# Patient Record
Sex: Female | Born: 1960 | Race: White | Hispanic: No | Marital: Married | Smoking: Never smoker
Health system: Southern US, Community
[De-identification: ages and names within clinical notes are randomized; demographics above are authoritative.]

## PROBLEM LIST (undated history)

## (undated) DIAGNOSIS — F32A Depression, unspecified: Secondary | ICD-10-CM

## (undated) DIAGNOSIS — F329 Major depressive disorder, single episode, unspecified: Secondary | ICD-10-CM

## (undated) DIAGNOSIS — E785 Hyperlipidemia, unspecified: Secondary | ICD-10-CM

## (undated) DIAGNOSIS — I10 Essential (primary) hypertension: Secondary | ICD-10-CM

## (undated) DIAGNOSIS — T7840XA Allergy, unspecified, initial encounter: Secondary | ICD-10-CM

## (undated) HISTORY — DX: Hyperlipidemia, unspecified: E78.5

## (undated) HISTORY — DX: Depression, unspecified: F32.A

## (undated) HISTORY — DX: Major depressive disorder, single episode, unspecified: F32.9

## (undated) HISTORY — DX: Allergy, unspecified, initial encounter: T78.40XA

## (undated) HISTORY — PX: SEPTOPLASTY: SUR1290

## (undated) HISTORY — PX: TONSILLECTOMY: SUR1361

## (undated) HISTORY — DX: Essential (primary) hypertension: I10

---

## 1999-10-29 ENCOUNTER — Other Ambulatory Visit: Admission: RE | Admit: 1999-10-29 | Discharge: 1999-10-29 | Payer: Self-pay | Admitting: Gynecology

## 2000-10-29 ENCOUNTER — Other Ambulatory Visit: Admission: RE | Admit: 2000-10-29 | Discharge: 2000-10-29 | Payer: Self-pay | Admitting: Gynecology

## 2001-03-05 ENCOUNTER — Other Ambulatory Visit: Admission: RE | Admit: 2001-03-05 | Discharge: 2001-03-05 | Payer: Self-pay | Admitting: Gynecology

## 2001-06-23 ENCOUNTER — Other Ambulatory Visit: Admission: RE | Admit: 2001-06-23 | Discharge: 2001-06-23 | Payer: Self-pay | Admitting: Gynecology

## 2001-11-01 ENCOUNTER — Other Ambulatory Visit: Admission: RE | Admit: 2001-11-01 | Discharge: 2001-11-01 | Payer: Self-pay | Admitting: Gynecology

## 2003-07-05 ENCOUNTER — Other Ambulatory Visit: Admission: RE | Admit: 2003-07-05 | Discharge: 2003-07-05 | Payer: Self-pay | Admitting: Gynecology

## 2004-11-20 ENCOUNTER — Ambulatory Visit (HOSPITAL_BASED_OUTPATIENT_CLINIC_OR_DEPARTMENT_OTHER): Admission: RE | Admit: 2004-11-20 | Discharge: 2004-11-20 | Payer: Self-pay | Admitting: Otolaryngology

## 2005-08-11 ENCOUNTER — Other Ambulatory Visit: Admission: RE | Admit: 2005-08-11 | Discharge: 2005-08-11 | Payer: Self-pay | Admitting: Gynecology

## 2006-09-22 ENCOUNTER — Other Ambulatory Visit: Admission: RE | Admit: 2006-09-22 | Discharge: 2006-09-22 | Payer: Self-pay | Admitting: Gynecology

## 2007-03-18 ENCOUNTER — Ambulatory Visit: Payer: Self-pay | Admitting: Family Medicine

## 2007-03-18 ENCOUNTER — Encounter: Payer: Self-pay | Admitting: Family Medicine

## 2007-03-18 ENCOUNTER — Encounter (INDEPENDENT_AMBULATORY_CARE_PROVIDER_SITE_OTHER): Payer: Self-pay | Admitting: Family Medicine

## 2007-03-18 DIAGNOSIS — F339 Major depressive disorder, recurrent, unspecified: Secondary | ICD-10-CM

## 2007-03-18 DIAGNOSIS — J309 Allergic rhinitis, unspecified: Secondary | ICD-10-CM | POA: Insufficient documentation

## 2007-03-18 LAB — CONVERTED CEMR LAB
Basophils Absolute: 0.1 10*3/uL (ref 0.0–0.1)
Basophils Relative: 1 % (ref 0.0–1.0)
HCT: 39.7 % (ref 36.0–46.0)
Hemoglobin: 13.7 g/dL (ref 12.0–15.0)
MCHC: 34.5 g/dL (ref 30.0–36.0)
Monocytes Absolute: 0.5 10*3/uL (ref 0.2–0.7)
Neutrophils Relative %: 71 % (ref 43.0–77.0)
RDW: 14.8 % — ABNORMAL HIGH (ref 11.5–14.6)

## 2007-05-15 ENCOUNTER — Ambulatory Visit: Payer: Self-pay | Admitting: Family Medicine

## 2007-07-20 ENCOUNTER — Ambulatory Visit: Payer: Self-pay | Admitting: Internal Medicine

## 2007-07-20 LAB — CONVERTED CEMR LAB
Basophils Absolute: 0.1 10*3/uL (ref 0.0–0.1)
Hemoglobin: 13 g/dL (ref 12.0–15.0)
Lymphocytes Relative: 24.2 % (ref 12.0–46.0)
MCHC: 34.4 g/dL (ref 30.0–36.0)
MCV: 83.9 fL (ref 78.0–100.0)
Monocytes Absolute: 0.5 10*3/uL (ref 0.2–0.7)
Monocytes Relative: 4.9 % (ref 3.0–11.0)
Neutro Abs: 6.1 10*3/uL (ref 1.4–7.7)

## 2007-07-21 ENCOUNTER — Encounter: Payer: Self-pay | Admitting: Internal Medicine

## 2007-08-24 ENCOUNTER — Ambulatory Visit: Payer: Self-pay | Admitting: Internal Medicine

## 2007-09-24 DIAGNOSIS — G4733 Obstructive sleep apnea (adult) (pediatric): Secondary | ICD-10-CM

## 2007-09-24 DIAGNOSIS — J40 Bronchitis, not specified as acute or chronic: Secondary | ICD-10-CM

## 2007-10-15 ENCOUNTER — Ambulatory Visit: Payer: Self-pay | Admitting: Family Medicine

## 2007-10-15 ENCOUNTER — Encounter: Admission: RE | Admit: 2007-10-15 | Discharge: 2007-10-15 | Payer: Self-pay | Admitting: Family Medicine

## 2007-10-15 ENCOUNTER — Telehealth (INDEPENDENT_AMBULATORY_CARE_PROVIDER_SITE_OTHER): Payer: Self-pay | Admitting: *Deleted

## 2007-10-15 DIAGNOSIS — S8010XA Contusion of unspecified lower leg, initial encounter: Secondary | ICD-10-CM

## 2007-10-29 ENCOUNTER — Telehealth (INDEPENDENT_AMBULATORY_CARE_PROVIDER_SITE_OTHER): Payer: Self-pay | Admitting: *Deleted

## 2007-11-01 ENCOUNTER — Ambulatory Visit: Payer: Self-pay | Admitting: Family Medicine

## 2007-11-01 DIAGNOSIS — B0059 Other herpesviral disease of eye: Secondary | ICD-10-CM

## 2007-11-04 ENCOUNTER — Telehealth (INDEPENDENT_AMBULATORY_CARE_PROVIDER_SITE_OTHER): Payer: Self-pay | Admitting: *Deleted

## 2007-11-29 ENCOUNTER — Telehealth (INDEPENDENT_AMBULATORY_CARE_PROVIDER_SITE_OTHER): Payer: Self-pay | Admitting: *Deleted

## 2008-01-17 ENCOUNTER — Telehealth (INDEPENDENT_AMBULATORY_CARE_PROVIDER_SITE_OTHER): Payer: Self-pay | Admitting: *Deleted

## 2008-01-17 ENCOUNTER — Encounter (INDEPENDENT_AMBULATORY_CARE_PROVIDER_SITE_OTHER): Payer: Self-pay | Admitting: Family Medicine

## 2008-01-17 ENCOUNTER — Ambulatory Visit: Payer: Self-pay | Admitting: Family Medicine

## 2008-01-17 DIAGNOSIS — M25569 Pain in unspecified knee: Secondary | ICD-10-CM

## 2008-01-24 ENCOUNTER — Encounter: Admission: RE | Admit: 2008-01-24 | Discharge: 2008-01-24 | Payer: Self-pay | Admitting: Neurosurgery

## 2008-01-25 ENCOUNTER — Telehealth (INDEPENDENT_AMBULATORY_CARE_PROVIDER_SITE_OTHER): Payer: Self-pay | Admitting: *Deleted

## 2008-01-28 ENCOUNTER — Encounter (INDEPENDENT_AMBULATORY_CARE_PROVIDER_SITE_OTHER): Payer: Self-pay | Admitting: Family Medicine

## 2008-02-15 ENCOUNTER — Observation Stay (HOSPITAL_COMMUNITY): Admission: EM | Admit: 2008-02-15 | Discharge: 2008-02-15 | Payer: Self-pay | Admitting: Emergency Medicine

## 2008-03-16 ENCOUNTER — Telehealth (INDEPENDENT_AMBULATORY_CARE_PROVIDER_SITE_OTHER): Payer: Self-pay | Admitting: *Deleted

## 2008-10-09 ENCOUNTER — Ambulatory Visit: Payer: Self-pay | Admitting: Family Medicine

## 2008-10-09 DIAGNOSIS — R030 Elevated blood-pressure reading, without diagnosis of hypertension: Secondary | ICD-10-CM

## 2008-10-31 IMAGING — CR DG CHEST 2V
2 series · 2 of 2 positions shown · non-contrast
Comparison: None

CLINICAL DATA: Followup bronchitis

CHEST - 2 VIEW:

[view not recorded (1 of 2)]
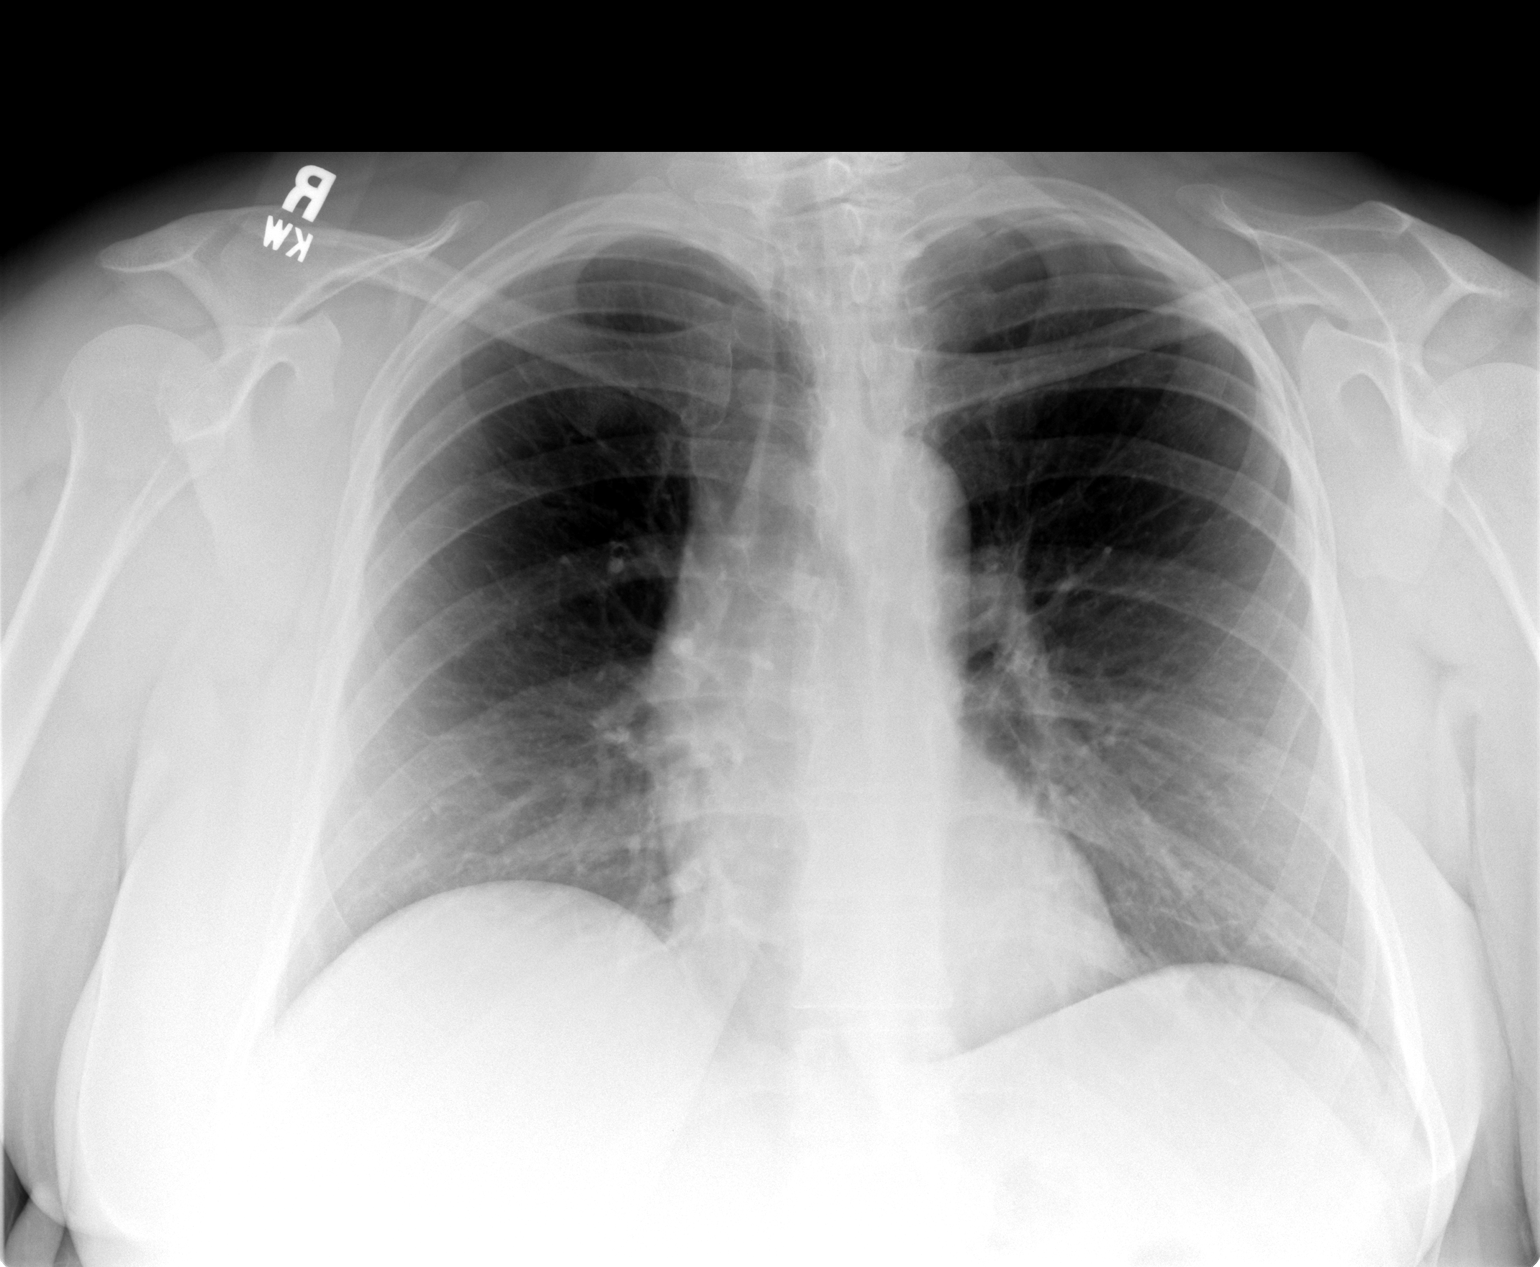

[view not recorded (2 of 2)]
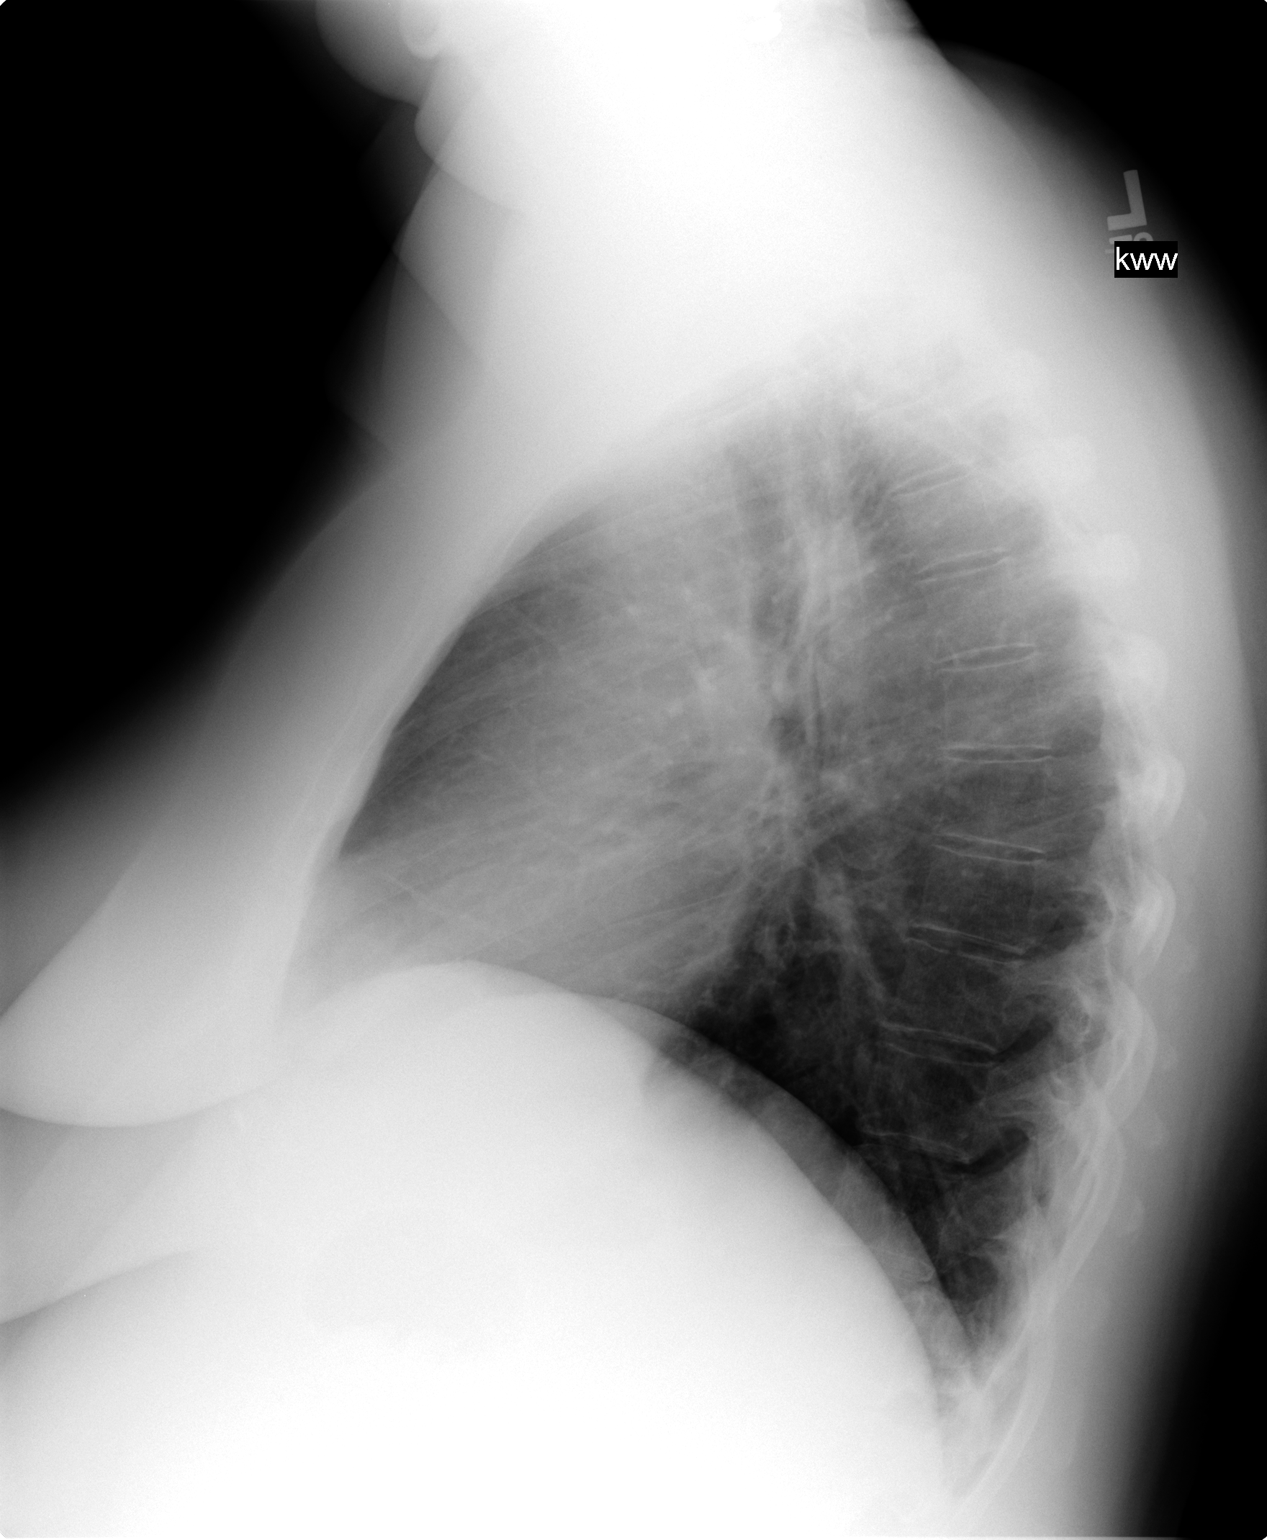

[2 of 2 positions shown; findings below may reference images not displayed]

FINDINGS: The heart size and mediastinal contours are within normal limits. 
Both lungs are clear.  The visualized skeletal structures are unremarkable.
IMPRESSION: No active cardiopulmonary disease

## 2008-11-06 ENCOUNTER — Ambulatory Visit: Payer: Self-pay | Admitting: Family Medicine

## 2008-11-06 DIAGNOSIS — I10 Essential (primary) hypertension: Secondary | ICD-10-CM

## 2008-11-06 LAB — CONVERTED CEMR LAB
Calcium: 8.9 mg/dL (ref 8.4–10.5)
GFR calc Af Amer: 68 mL/min
Sodium: 137 meq/L (ref 135–145)

## 2008-12-06 ENCOUNTER — Ambulatory Visit: Payer: Self-pay | Admitting: Family Medicine

## 2008-12-06 DIAGNOSIS — M722 Plantar fascial fibromatosis: Secondary | ICD-10-CM

## 2008-12-11 ENCOUNTER — Telehealth (INDEPENDENT_AMBULATORY_CARE_PROVIDER_SITE_OTHER): Payer: Self-pay | Admitting: *Deleted

## 2008-12-11 LAB — CONVERTED CEMR LAB
Albumin: 3.4 g/dL — ABNORMAL LOW (ref 3.5–5.2)
Alkaline Phosphatase: 70 units/L (ref 39–117)
BUN: 10 mg/dL (ref 6–23)
Calcium: 8.7 mg/dL (ref 8.4–10.5)
Cholesterol: 140 mg/dL (ref 0–200)
Direct LDL: 76.5 mg/dL
Eosinophils Absolute: 0.3 10*3/uL (ref 0.0–0.7)
Eosinophils Relative: 4.3 % (ref 0.0–5.0)
GFR calc Af Amer: 86 mL/min
GFR calc non Af Amer: 71 mL/min
HCT: 34.6 % — ABNORMAL LOW (ref 36.0–46.0)
Hemoglobin: 11.7 g/dL — ABNORMAL LOW (ref 12.0–15.0)
MCV: 82.7 fL (ref 78.0–100.0)
Monocytes Absolute: 0.4 10*3/uL (ref 0.1–1.0)
Neutro Abs: 5.4 10*3/uL (ref 1.4–7.7)
Platelets: 251 10*3/uL (ref 150–400)
Potassium: 4.2 meq/L (ref 3.5–5.1)
RDW: 15.1 % — ABNORMAL HIGH (ref 11.5–14.6)
TSH: 1.21 microintl units/mL (ref 0.35–5.50)
Total Protein: 6.7 g/dL (ref 6.0–8.3)
Triglycerides: 250 mg/dL (ref 0–149)
WBC: 7.8 10*3/uL (ref 4.5–10.5)

## 2008-12-13 ENCOUNTER — Encounter (INDEPENDENT_AMBULATORY_CARE_PROVIDER_SITE_OTHER): Payer: Self-pay | Admitting: *Deleted

## 2008-12-13 ENCOUNTER — Ambulatory Visit: Payer: Self-pay | Admitting: Family Medicine

## 2008-12-13 DIAGNOSIS — J019 Acute sinusitis, unspecified: Secondary | ICD-10-CM

## 2008-12-25 ENCOUNTER — Encounter (INDEPENDENT_AMBULATORY_CARE_PROVIDER_SITE_OTHER): Payer: Self-pay | Admitting: *Deleted

## 2009-02-08 ENCOUNTER — Ambulatory Visit: Payer: Self-pay | Admitting: Family Medicine

## 2009-02-28 ENCOUNTER — Ambulatory Visit: Payer: Self-pay | Admitting: Family Medicine

## 2009-02-28 DIAGNOSIS — K219 Gastro-esophageal reflux disease without esophagitis: Secondary | ICD-10-CM

## 2009-03-02 ENCOUNTER — Telehealth (INDEPENDENT_AMBULATORY_CARE_PROVIDER_SITE_OTHER): Payer: Self-pay | Admitting: *Deleted

## 2009-03-08 ENCOUNTER — Encounter: Payer: Self-pay | Admitting: Family Medicine

## 2009-08-02 ENCOUNTER — Ambulatory Visit: Payer: Self-pay | Admitting: Family Medicine

## 2009-08-02 DIAGNOSIS — L659 Nonscarring hair loss, unspecified: Secondary | ICD-10-CM | POA: Insufficient documentation

## 2009-08-02 DIAGNOSIS — L989 Disorder of the skin and subcutaneous tissue, unspecified: Secondary | ICD-10-CM | POA: Insufficient documentation

## 2009-08-02 DIAGNOSIS — G5620 Lesion of ulnar nerve, unspecified upper limb: Secondary | ICD-10-CM

## 2009-08-31 ENCOUNTER — Encounter: Payer: Self-pay | Admitting: Internal Medicine

## 2009-09-11 ENCOUNTER — Ambulatory Visit: Payer: Self-pay | Admitting: Family Medicine

## 2009-09-11 DIAGNOSIS — N390 Urinary tract infection, site not specified: Secondary | ICD-10-CM

## 2009-09-11 LAB — CONVERTED CEMR LAB
Bilirubin Urine: NEGATIVE
Glucose, Urine, Semiquant: NEGATIVE
Protein, U semiquant: NEGATIVE
Specific Gravity, Urine: <1.005
pH: 7.5

## 2009-09-12 ENCOUNTER — Encounter: Payer: Self-pay | Admitting: Family Medicine

## 2009-09-17 ENCOUNTER — Telehealth (INDEPENDENT_AMBULATORY_CARE_PROVIDER_SITE_OTHER): Payer: Self-pay | Admitting: *Deleted

## 2009-11-23 ENCOUNTER — Encounter (INDEPENDENT_AMBULATORY_CARE_PROVIDER_SITE_OTHER): Payer: Self-pay | Admitting: *Deleted

## 2010-01-21 ENCOUNTER — Telehealth: Payer: Self-pay | Admitting: Family

## 2010-01-21 ENCOUNTER — Encounter (INDEPENDENT_AMBULATORY_CARE_PROVIDER_SITE_OTHER): Payer: Self-pay | Admitting: *Deleted

## 2010-01-21 ENCOUNTER — Ambulatory Visit: Payer: Self-pay | Admitting: Cardiovascular Disease

## 2010-01-21 ENCOUNTER — Ambulatory Visit: Payer: Self-pay | Admitting: Family

## 2010-01-21 DIAGNOSIS — R011 Cardiac murmur, unspecified: Secondary | ICD-10-CM

## 2010-01-21 DIAGNOSIS — R1031 Right lower quadrant pain: Secondary | ICD-10-CM

## 2010-01-21 DIAGNOSIS — R7309 Other abnormal glucose: Secondary | ICD-10-CM

## 2010-01-21 LAB — CONVERTED CEMR LAB
Chloride: 105 meq/L (ref 96–112)
Hgb A1c MFr Bld: 6.4 % — ABNORMAL HIGH (ref 4.6–6.1)
Potassium: 4.1 meq/L (ref 3.5–5.3)
Sodium: 137 meq/L (ref 135–145)

## 2010-01-30 ENCOUNTER — Encounter (INDEPENDENT_AMBULATORY_CARE_PROVIDER_SITE_OTHER): Payer: Self-pay | Admitting: *Deleted

## 2010-02-04 ENCOUNTER — Ambulatory Visit: Payer: Self-pay | Admitting: Family

## 2010-02-04 DIAGNOSIS — E119 Type 2 diabetes mellitus without complications: Secondary | ICD-10-CM | POA: Insufficient documentation

## 2010-02-04 LAB — CONVERTED CEMR LAB
Creatinine,U: 52.8 mg/dL
Microalb, Ur: 0.8 mg/dL (ref 0.0–1.9)

## 2010-02-11 ENCOUNTER — Encounter: Payer: Self-pay | Admitting: Family Medicine

## 2010-02-11 ENCOUNTER — Ambulatory Visit (HOSPITAL_COMMUNITY): Admission: RE | Admit: 2010-02-11 | Discharge: 2010-02-11 | Payer: Self-pay | Admitting: Family

## 2010-02-11 ENCOUNTER — Ambulatory Visit: Payer: Self-pay | Admitting: Internal Medicine

## 2010-02-11 ENCOUNTER — Ambulatory Visit: Payer: Self-pay

## 2010-02-13 ENCOUNTER — Telehealth (INDEPENDENT_AMBULATORY_CARE_PROVIDER_SITE_OTHER): Payer: Self-pay | Admitting: *Deleted

## 2010-02-18 ENCOUNTER — Encounter (INDEPENDENT_AMBULATORY_CARE_PROVIDER_SITE_OTHER): Payer: Self-pay | Admitting: *Deleted

## 2010-03-28 ENCOUNTER — Ambulatory Visit (HOSPITAL_COMMUNITY): Admission: RE | Admit: 2010-03-28 | Discharge: 2010-03-28 | Payer: Self-pay | Admitting: Obstetrics and Gynecology

## 2010-05-30 ENCOUNTER — Telehealth (INDEPENDENT_AMBULATORY_CARE_PROVIDER_SITE_OTHER): Payer: Self-pay | Admitting: *Deleted

## 2010-11-12 ENCOUNTER — Telehealth (INDEPENDENT_AMBULATORY_CARE_PROVIDER_SITE_OTHER): Payer: Self-pay | Admitting: *Deleted

## 2011-01-16 NOTE — Progress Notes (Signed)
Summary: echo-lmom  Phone Note Outgoing Call   Call placed by: Doristine Devoid,  February 13, 2010 11:15 AM Call placed to: Patient Summary of Call: Pt's murmur is a flow murmur.  No abnormalitites seen on ECHO.  Please notify pt.   Follow-up for Phone Call        left message on machine ....Marland KitchenMarland KitchenDoristine Devoid  February 13, 2010 11:15 AM   will mail letter .Marland KitchenMarland KitchenDoristine Devoid  February 18, 2010 4:59 PM

## 2011-01-16 NOTE — Assessment & Plan Note (Signed)
Summary: 2 WEEK FU/KDC   Vital Signs:  Patient profile:   50 year old female Weight:      255 pounds Pulse rate:   76 / minute BP sitting:   132 / 80  (left arm)  Vitals Entered By: Doristine Devoid (February 04, 2010 10:52 AM) CC: 2 WEEK    CC:  2 WEEK .  History of Present Illness: Vanessa Crawford is a 50 year old female who presents today for follow up of her right lower quadrant pain.  Pain was previously rated as 10/10, now better- maybe 3-4/10.  CT abdomen and pelvis was unremarkable. She has a saline ultrasound with GYN next week. Also noted to have elevated blood sugar at that visit.    Allergies: 1)  ! Penicillin 2)  ! Sulfa 3)  ! Aspirin  Review of Systems       Denies urinary frequency or nocturia.  Denies diarrhea or bloody or black stools.    Physical Exam  General:  obese white female in NAD Head:  Normocephalic and atraumatic without obvious abnormalities. No apparent alopecia or balding. Neck:  No deformities, masses, or tenderness noted. Lungs:  Normal respiratory effort, chest expands symmetrically. Lungs are clear to auscultation, no crackles or wheezes. Heart:  Normal rate and regular rhythm. S1 and S2 normal without gallop, murmur, click, rub or other extra sounds. Abdomen:  mild tenderness to deep palpation in right lower quadrand   Impression & Recommendations:  Problem # 1:  ABDOMINAL PAIN, RIGHT LOWER QUADRANT (ICD-789.03) Assessment Improved Patient to f/u with GYN for saline [place].  I offered referral to GI for further eval- she declines at this time, but will consider pending results of her [place] with GYN.  Instructed patient to call if she wishes to proceed with this referral.    Problem # 2:  DM (ICD-250.00) Assessment: New  Patient will make appointment with Dr Nile Riggs for eye exam.  Will refer to podiatry.  Patient advised on diabetic diet and need to lose weight, exercise.  Plan f/u for a complete physical and will check FLP at that time.  Her  updated medication list for this problem includes:    Lisinopril 10 Mg Tabs (Lisinopril) .Marland Kitchen... 1 tab by mouth daily  Orders: Podiatry Referral (Podiatry) TLB-Microalbumin/Creat Ratio, Urine (82043-MALB)  Complete Medication List: 1)  Valtrex 500 Mg Tabs (Valacyclovir hcl) .Marland Kitchen.. 1 by mouth two times a day for 3 days 2)  Lisinopril 10 Mg Tabs (Lisinopril) .Marland Kitchen.. 1 tab by mouth daily 3)  Fluticasone Propionate 50 Mcg/act Susp (Fluticasone propionate) .... 2 sprays each nostril once daily 4)  Omeprazole 20 Mg Cpdr (Omeprazole) .Marland Kitchen.. 1-2 tabs by mouth daily 5)  Allegra 180 Mg Tabs (Fexofenadine hcl) .Marland Kitchen.. 1 by mouth in am, 1 by mouth in pm 6)  Mobic 7.5 Mg Tabs (Meloxicam) .... One tablet by mouth daily as needed pain  Patient Instructions: 1)  Please return fasting for a full physical. 2)  Please work hard on diabetic diet, exercise and weight loss

## 2011-01-16 NOTE — Progress Notes (Signed)
Summary: valtrex refill   Phone Note Refill Request Message from:  Patient on November 12, 2010 11:20 AM  Refills Requested: Medication #1:  VALTREX 500 MG  TABS 1 by mouth two times a day for 3 days Initial call taken by: Doristine Devoid CMA,  November 12, 2010 11:20 AM    Prescriptions: VALTREX 500 MG  TABS (VALACYCLOVIR HCL) 1 by mouth two times a day for 3 days  #30 x 1   Entered by:   Doristine Devoid CMA   Authorized by:   Neena Rhymes MD   Signed by:   Doristine Devoid CMA on 11/12/2010   Method used:   Electronically to        [facility] (retail)       [address]       [place], [place]  [address]       Ph: [phone]       Fax: [phone]   RxID:   [id number]

## 2011-01-16 NOTE — Letter (Signed)
Summary: Work Dietitian at [employer]  [address] [place], [place] [address]   Phone: [phone]  Fax: [phone]    Today's Date: January 21, 2010  Name of Patient: Vanessa Crawford  The above named patient had a medical visit today at:  8:30am / pm.  Please take this into consideration when reviewing the time away from work.    Special Instructions:  [  ] None  [  ] To be off the remainder of today, returning to the normal work / school schedule tomorrow.  [  ] To be off until the next scheduled appointment on ______________________.  Arly.Keller  ] Other  Please excuse for appt today but will need to leave by 11:00am for another scheduled appt to be off for remainder of the day and may resume regular work activity on 2/8/11__ ________________________________________________________________________   Sincerely yours,   Doristine Devoid

## 2011-01-16 NOTE — Progress Notes (Signed)
  Phone Note Outgoing Call   Summary of Call: please notify patient that her blood work does indicate that her sugar is slightly elevated.  She should follow up in 2 weeks so that I can further discuss with her please. Initial call taken by: Lemont Fillers FNP,  January 21, 2010 5:51 PM     Appended Document: discuss lab-lmomx2    Phone Note Outgoing Call   Summary of Call: left message on machine ....Marland KitchenMarland KitchenDoristine Devoid  January 22, 2010 12:01 PM   left message on machine .Marland KitchenDoristine Devoid  January 29, 2010 11:02 AM   will mail letter for patient to f/u .......Marland KitchenDoristine Devoid  January 30, 2010 4:45 PM

## 2011-01-16 NOTE — Progress Notes (Signed)
  Phone Note Outgoing Call   Call placed by: Lemont Fillers FNP,  January 21, 2010 5:02 PM Summary of Call: Called patient, reviewed neg CT abd results.  I suspect that her pain in musculoskeletal in nature.  Will give trial of mobic.    New/Updated Medications: MOBIC 7.5 MG TABS (MELOXICAM) one tablet by mouth daily as needed pain Prescriptions: MOBIC 7.5 MG TABS (MELOXICAM) one tablet by mouth daily as needed pain  #20 x 0   Entered and Authorized by:   Lemont Fillers FNP   Signed by:   Lemont Fillers FNP on 01/21/2010   Method used:   Electronically to        [facility] (retail)       [address]       [place], [place]  [address]       Ph: [phone]       Fax: [phone]   RxID:   [id number]

## 2011-01-16 NOTE — Assessment & Plan Note (Signed)
Summary: heart murmur/ abd pain//lh   Vital Signs:  Patient profile:   50 year old female Height:      68.75 inches Weight:      254 pounds BMI:     37.92 Temp:     98.3 degrees F oral Pulse rate:   84 / minute BP sitting:   140 / 80  (left arm)  Vitals Entered By: Vanessa Crawford (January 21, 2010 8:31 AM) CC: RLQ pain went to GYN says it's not ovaries but area is tender and radiating to R hip, also GYN says he detected heart murmur during exam   CC:  RLQ pain went to GYN says it's not ovaries but area is tender and radiating to R hip and also GYN says he detected heart murmur during exam.  History of Present Illness: Vanessa Crawford is a 50 year old female who presents today with complain of right lower quadrant pain x 6 months.  Initially started the week before each cycle about 6 months ago- now continuous.  She saw her GYN last week and had a transvaginal ultrasound-  per patient this was normal and she was by Dr. Teodora Medici that her pain was not due to her ovaries.  GYN also noted a murmur which patient tells me she was unaware of.    She notes that her pain is located in the right lower quadrant and radiates around her right hip.  Walking seems to help the pain.  Pain is made worse after periods of immobility.  She has been using advil and a heating pad with some improvement.    Allergies: 1)  ! Penicillin 2)  ! Sulfa 3)  ! Aspirin  Physical Exam  General:  obese white female, NAD Head:  Normocephalic and atraumatic without obvious abnormalities. No apparent alopecia or balding. Lungs:  Normal respiratory effort, chest expands symmetrically. Lungs are clear to auscultation, no crackles or wheezes. Heart:  very faint grade 1/6 late systolic murmur, no clicks, normal s1/s2 Abdomen:  + tenderness to palpation right lower quadrant without guarding.  + bowel sounds, abdomen is soft.   Impression & Recommendations:  Problem # 1:  ABDOMINAL PAIN, RIGHT LOWER QUADRANT  (ICD-789.03) Assessment Deteriorated Will order CT abd/pelvis, need to r/o appendicitis of diverticulitis.   Orders: Misc. Referral (Misc. Ref)  Problem # 2:  MURMUR (ICD-785.2) Assessment: New  Will send for 2-D echo to further evaluate Orders: Echo Referral (Echo)  EKG obtained (see interpretation); Will refer to cardiology for evaluation of this murmur.   Problem # 3:  HYPERGLYCEMIA, BORDERLINE (ICD-790.29) Noted history of random sugars in the 180's, will repeat today along with A1C to further evaluate. Orders: Venipuncture ([id number])  Complete Medication List: 1)  Valtrex 500 Mg Tabs (Valacyclovir hcl) .Marland Kitchen.. 1 by mouth two times a day for 3 days 2)  Lisinopril 10 Mg Tabs (Lisinopril) .Marland Kitchen.. 1 tab by mouth daily 3)  Fluticasone Propionate 50 Mcg/act Susp (Fluticasone propionate) .... 2 sprays each nostril once daily 4)  Omeprazole 20 Mg Cpdr (Omeprazole) .Marland Kitchen.. 1-2 tabs by mouth daily 5)  Allegra 180 Mg Tabs (Fexofenadine hcl) .Marland Kitchen.. 1 by mouth in am, 1 by mouth in pm  Patient Instructions: 1)  Please complete blood work today before leaving.   2)  You will be contacted about your appointment for your ct scan and your echo.  3)  Go to ER if severe abdominal pain. 4)  Please schedule a follow-up appointment in 2 weeks.

## 2011-01-16 NOTE — Progress Notes (Signed)
Summary: NEED TO RESEND RX FOR LISINOPRIL  Phone Note Call from Patient   Caller: Patient Summary of Call: PATIENT CALLED TO REPORT THAT [facility] DOES NOT SHOW RECEIVING PRESCRIPTION FOR LISINOPRIL--PLEASE RESEND Initial call taken by: Jerolyn Shin,  May 30, 2010 9:32 AM    Prescriptions: LISINOPRIL 10 MG TABS (LISINOPRIL) 1 tab by mouth daily  #30 Tablet x 2   Entered by:   Doristine Devoid   Authorized by:   Neena Rhymes MD   Signed by:   Doristine Devoid on 05/30/2010   Method used:   Electronically to        [facility] (retail)       [address]       [place], [place]  [address]       Ph: [phone]       Fax: [phone]   RxID:   [id number]

## 2011-01-16 NOTE — Letter (Signed)
Summary: Primary Care Appointment Letter  [facility] [place]  [address] [place], [place] [address]   Phone: [phone]  Fax: [phone]    01/30/2010 MRN: [id number]  [facility] [address] [place], [place]  [address]  Dear Ms. Aiello,   Your Primary Care Physician Neena Rhymes MD has indicated that:    _______it is time to schedule an appointment.    _______you missed your appointment on______ and need to call and          reschedule.    _______you need to have lab work done.    ____X___you need to schedule an appointment discuss lab or test results.    _______you need to call to reschedule your appointment that is                       scheduled on _________.     Please call our office as soon as possible. Our phone number is [phone]_____. Our office is open 8a-5p, Monday through Friday.     Thank you,     Primary Care Scheduler

## 2011-01-16 NOTE — Letter (Signed)
   [place] [facility] [address] [place], [place]  [address] Phone: [phone]      February 18, 2010   [facility] [address] [place], [place] [address]  RE:  LAB RESULTS  Dear  Ms. Hoskinson,  The following is an interpretation of your most recent lab tests.  Please take note of any instructions provided or changes to medications that have resulted from your lab work.    Echo done showed murmur is a flow murmur.  No abnormalitites seen on ECHO.

## 2011-03-05 LAB — CBC
HCT: 37.7 % (ref 36.0–46.0)
Platelets: 272 10*3/uL (ref 150–400)
RDW: 15.6 % — ABNORMAL HIGH (ref 11.5–15.5)
WBC: 9.4 10*3/uL (ref 4.0–10.5)

## 2011-03-05 LAB — TYPE AND SCREEN: Antibody Screen: NEGATIVE

## 2011-03-05 LAB — COMPREHENSIVE METABOLIC PANEL
AST: 13 U/L (ref 0–37)
Albumin: 3.7 g/dL (ref 3.5–5.2)
Alkaline Phosphatase: 83 U/L (ref 39–117)
BUN: 13 mg/dL (ref 6–23)
Chloride: 104 mEq/L (ref 96–112)
Creatinine, Ser: 1.01 mg/dL (ref 0.4–1.2)
GFR calc Af Amer: >60 mL/min (ref >60)
Potassium: 4.1 mEq/L (ref 3.5–5.1)
Total Bilirubin: 0.5 mg/dL (ref 0.3–1.2)
Total Protein: 7 g/dL (ref 6.0–8.3)

## 2011-03-05 LAB — PREGNANCY, URINE: Preg Test, Ur: NEGATIVE

## 2011-03-05 LAB — GLUCOSE, CAPILLARY: Glucose-Capillary: 182 mg/dL — ABNORMAL HIGH (ref 70–99)

## 2011-04-29 NOTE — Assessment & Plan Note (Signed)
[facility]                             PULMONARY OFFICE NOTE   Vanessa Crawford, Vanessa Crawford                    MRN:          [id number]  DATE:07/20/2007                            DOB:          Apr 07, 1961    PROBLEM:  Pulmonary consultation at the kind request of Dr. Blossom Hoops  for this 50 year old woman who is concerned about dyspnea with exertion  and difficulty with her breathing at night.   HISTORY:  She has been aware of exertional dyspnea, especially in the  last 6 months and recognizes it with routine housework that she thinks  she ought to be able to do without problem.  She has been gaining some  weight.  There is no history of asthma but she has had allergic rhinitis  in the past and she describes episodic bronchitis without pneumonia.  She had acute bronchitis early this summer and still has a cough with  occasional clear phlegm.  There has been a bit of wheezing.  She was  diagnosed with obstructive sleep apnea by Dr. Narda Bonds with the  study done at [facility] about 2 years ago.  She has been  using CPAP at unknown pressure through [facility].  It had seemed  to do well in the past using nasal pillows and a humidifier.  This  summer she has been finding it more difficult and to maintain sleep and  she is being told that she snores through her CPAP.   MEDICATIONS:  1. Effexor 150 mg.  2. CPAP at unknown pressure.   DRUG INTOLERANT PENICILLIN AND SULFA.   REVIEW OF SYSTEMS:  Shortness of breath mainly with exertion, not at  rest.  Productive and sometimes nonproductive cough with scant white  sputum.  Nonspecific chest pains, headaches, nasal congestion, steady  weight gain, occasional heart burn.   PAST HISTORY:  1. Allergic rhinitis treated comfortably with over the counter      medicines.  2. Septoplasty x2.  3. Obstructive sleep apnea with unknown pressure CPAP.  4. Tonsillectomy.  5. Endometriosis.  6. No  history of cardiac, diabetic or clotting disorder.  7. ASPIRIN CAUSES GI UPSET.   SOCIAL HISTORY:  Never smoked.  Married with children.  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   FAMILY HISTORY:  Heart disease, nobody known to have lung disease.   OBJECTIVE:  Weight 264 pounds, blood pressure 122/88, pulse 114, room  air saturation 99%.  This is an overweight, alert, pleasant woman with  intermittent throat clearing-type cough.  SKIN:  There are some excoriations at her wrists.  ADENOPATHY:  None found.  HEENT:  Pharynx is minimally reddened, palate is long at 3-4/4, voice  quality is normal with no stridor or thyromegaly.  Her nasal airway  seems clear.  CHEST:  No wheeze, rales, rhonchi.  Mild throat clearing-type cough is  noted a few times.  HEART:  Sounds are regular without murmur or gallop.  EXTREMITIES:  No cyanosis, clubbing, edema or tremor.   IMPRESSION:  1. Obstructive sleep apnea at unknown continuous positive airway  pressure.  History suggests that she may have gained weight and      gotten out of the control range for current continuous positive      airway pressure settings.  2. Possible reflux.  3. Obesity.  4. Dyspnea.  5. Bronchitis.   PLAN:  1. We are going to contact [facility] to find what CPAP they      have her now and advance that pressure by 1 CWP.  2. Blood today for CBC and a D-dimer.  3. Schedule pulmonary function test.  4. Schedule chest x-ray.  5. Schedule return 1 month, earlier p.r.n.   I appreciate the chance to see her.     Clinton D. Maple Hudson, MD, Tonny Bollman, FACP  Electronically Signed    CDY/MedQ  DD: 07/20/2007  DT: 07/21/2007  Job #: [id number]   cc:   Leanne Chang, M.D.

## 2011-04-29 NOTE — Assessment & Plan Note (Signed)
[facility]                             PULMONARY OFFICE NOTE   Vanessa Crawford, Vanessa Crawford                    MRN:          [id number]  DATE:08/24/2007                            DOB:          1961-06-01    PULMONARY OFFICE FOLLOWUP   PROBLEM LIST:  1. Obstructive sleep apnea at CPAP 10.  2. Dyspnea.  3. Obesity.  4. Bronchitis.   HISTORY:  Using CPAP every night at 10 CWP and she says it is fine.  She does complain that she is now routinely waking between 2 and 3 a.m.  and lying awake tossing and turning.  She says she coughs all the  time.  Little sputum.  No chest pain or palpitations.  No drainage.  We  are not sure about reflux.   MEDICATIONS:  1. Effexor 150 mg.  2. CPAP 10 CWP.   DRUG INTOLERANCES:  PENICILLIN.  SULFA.   OBJECTIVE:  Weight 267 pounds, BP 128/80, pulse 112, room air saturation  99%.  She seems alert.  Has recently had some skin lesions burned off of her  arms.  Somewhat raspy cough, upper airway noises without rales, wheeze, or  rhonchi.  HEART:  Sounds are regular without murmur.  No adenopathy.  No edema.   CHEST X-RAY:  Film from July 20, 2007 showed no active cardiopulmonary  disease.   CBC from July 20, 2007 was completely normal with a hemoglobin of 13.  Fibrin D-dimer from August 5 was within normal limits at 0.28.  Pulmonary function testing showed normal spirometry with small but  significant small airway improvement after bronchodilator.  Normal lung  volumes and normal diffusion capacity.  On a 6-minute walk test, she  went 525 meters.  Oxygen saturation was well-maintained at 99-98%.  She  had a  hypertensive response to this exercise with BP at baseline  128/80, rising by end of 6 minute walk to 170/98 and 2 minutes later  150/90.  Heart rate rose from 112 to 151, 2 minutes later was 119.  Oxygenation 99%, 98%, 99%.   IMPRESSION:  1. Dyspnea likely reflects deconditioning and cardiac status more  than      pulmonary limitation.  2. Obstructive sleep apnea with insomnia.  3. Reflux, suspicious basis for cough.   PLAN:  1. Continue CPAP at 10 CWP through [facility].  2. Cool bedroom to aid with sleep maintenance.  3. May try Sonata 5 mg at 3:30 a.m. p.r.n.  4. Try to reduce shortness of breath and exertional dyspnea by getting      some modest regular exercise, and talk with Dr. Blossom Hoops about      indication for a stress test.  5. Try sample Proventil HFA rescue inhaler.  6. Schedule return in 3 months, earlier p.r.n.     Clinton D. Maple Hudson, MD, Tonny Bollman, FACP  Electronically Signed    CDY/MedQ  DD: 08/24/2007  DT: 08/25/2007  Job #: [id number]   cc:   Leanne Chang, M.D.

## 2011-05-02 NOTE — Procedures (Signed)
NAMEVENITA, Vanessa Crawford             ACCOUNT NO.:  [id number]   MEDICAL RECORD NO.:  [id number]          PATIENT TYPE:  OUT   LOCATION:  SLEEP CENTER                 FACILITY[facility]  [facility]   PHYSICIAN:  Clinton D. Maple Hudson, M.D. DATE OF BIRTH:  05-13-1961   DATE OF STUDY:  11/20/2004                              NOCTURNAL POLYSOMNOGRAM   REFERRING PHYSICIAN:  Dr. Dillard Cannon   INDICATION FOR STUDY:  Insomnia with sleep apnea.   EPWORTH SLEEPINESS SCORE:  15/24   BMI:  34.8.   WEIGHT:  230 pounds   SLEEP ARCHITECTURE:  Total sleep time 416 minutes with sleep efficiency 88%.  Stage I was 11%, Stage II 69%, Stages III and IV were absent, REM was 20% of  total sleep time. Sleep latency 11 minutes, REM latency 206 minutes, awake  after sleep onset 45 minutes, arousal index elevated 49.8.   RESPIRATORY DATA:  Split-study protocol.  RDI 78.8/hr indicating severe  obstructive sleep apnea/hypopnea syndrome before CPAP.  This included 62  obstructive apneas and 156 hypopneas before CPAP.  Events were not  positional. REM RDI 7.3/hr. CPAP was titrated to 11 CWP, RDI 3.1/hr using a  large ResMed swift with small nasal pillows.   OXYGEN DATA:  Moderate snoring with oxygen desaturations to a nadir of 83%  before CPAP.  After CPAP control, saturation held 96-97% on room air.   CARDIAC DATA:  Normal sinus rhythm.   MOVEMENT/PARASOMNIA:  A total of 75 limb jerks were recorded of which 33  were associated with arousal or awakening for a periodic limb movement with  arousal index of 4.8/hr, which is increased.   IMPRESSION/RECOMMENDATION:  Severe obstructive sleep apnea/hypopnea  syndrome, respiratory disturbance index 78.8/hr with desaturation to 83%.  CPAP titration to 11 CWP, respiratory disturbance index 3.1/hr  using a ResMed Swift with small nasal pillows.  Consider adding a heated  humidifier.  Periodic limb movement with arousal, 4.8/hr.               Clinton D. Maple Hudson, M.D.  Diplomate, [employer] Board   CDY/MEDQ  D:  11/24/2004 12:49:48  T:  11/25/2004 06:57:44  Job:  [id number]

## 2011-09-16 ENCOUNTER — Encounter: Payer: Self-pay | Admitting: Family Medicine

## 2011-09-17 ENCOUNTER — Ambulatory Visit (INDEPENDENT_AMBULATORY_CARE_PROVIDER_SITE_OTHER): Payer: 59 | Admitting: Family Medicine

## 2011-09-17 VITALS — BP 130/86 | Temp 98.7°F | Wt 262.0 lb

## 2011-09-17 DIAGNOSIS — J40 Bronchitis, not specified as acute or chronic: Secondary | ICD-10-CM

## 2011-09-17 MED ORDER — BENZONATATE 200 MG PO CAPS: 200.0000 mg | ORAL_CAPSULE | Freq: Three times a day (TID) | ORAL | Status: DC | PRN

## 2011-09-17 MED ORDER — AZITHROMYCIN 250 MG PO TABS: 250.0000 mg | ORAL_TABLET | Freq: Every day | ORAL | Status: AC

## 2011-09-17 NOTE — Assessment & Plan Note (Signed)
Given duration of sxs will start Zpack to tx bronchitis.  Cough meds prn.  Reviewed supportive care and red flags that should prompt return.  Pt expressed understanding and is in agreement w/ plan.

## 2011-09-17 NOTE — Progress Notes (Signed)
  Subjective:    Patient ID: Vanessa Crawford, female    DOB: 20-Jul-1961, 50 y.o.   MRN: [id number]  HPI URI- sxs started 3 weeks ago w/ allergy sxs.  Improved last week but then returned this week.  + productive cough, nasal congestion, ear fullness.  Denies facial pain/pressure.  No fevers.  No known sick contacts.  No sore throat.   Review of Systems For ROS see HPI     Objective:   Physical Exam  Vitals reviewed. Constitutional: She appears well-developed and well-nourished. No distress.  HENT:  Head: Normocephalic and atraumatic.       TMs normal bilaterally Mild nasal congestion Throat w/out erythema, edema, or exudate  Eyes: Conjunctivae and EOM are normal. Pupils are equal, round, and reactive to light.  Neck: Normal range of motion. Neck supple.  Cardiovascular: Normal rate, regular rhythm, normal heart sounds and intact distal pulses.   No murmur heard. Pulmonary/Chest: Effort normal and breath sounds normal. No respiratory distress. She has no wheezes.       + hacking cough  Lymphadenopathy:    She has no cervical adenopathy.          Assessment & Plan:

## 2011-09-17 NOTE — Patient Instructions (Signed)
This appears to be a bronchitis Take the Azithromycin as directed Use the Tessalon as needed for cough Add Mucinex to thin your congestion Drink plenty of fluids! REST! Hang in there!!!

## 2011-10-07 ENCOUNTER — Encounter: Payer: Self-pay | Admitting: Family Medicine

## 2011-10-07 ENCOUNTER — Ambulatory Visit (INDEPENDENT_AMBULATORY_CARE_PROVIDER_SITE_OTHER): Payer: 59 | Admitting: Family Medicine

## 2011-10-07 VITALS — BP 129/82 | HR 105 | Temp 98.4°F | Ht 69.0 in | Wt 263.6 lb

## 2011-10-07 DIAGNOSIS — R05 Cough: Secondary | ICD-10-CM | POA: Insufficient documentation

## 2011-10-07 MED ORDER — ALBUTEROL SULFATE HFA 108 (90 BASE) MCG/ACT IN AERS: 2.0000 | INHALATION_SPRAY | RESPIRATORY_TRACT | Status: DC | PRN

## 2011-10-07 MED ORDER — LOSARTAN POTASSIUM 100 MG PO TABS: 100.0000 mg | ORAL_TABLET | Freq: Every day | ORAL | Status: DC

## 2011-10-07 NOTE — Progress Notes (Signed)
  Subjective:    Patient ID: Vanessa Crawford, female    DOB: December 27, 1960, 50 y.o.   MRN: [id number]  HPI Cough- 'i feel ok but i can't get beyond the cough'.  No fevers.  Finished Zpack.  Has hx of severe seasonal allergies.  Unable to take deep breaths, talk.  Denies facial pain, ear pain.  + nasal congestion.   Review of Systems For ROS see HPI     Objective:   Physical Exam  Vitals reviewed. Constitutional: She appears well-developed and well-nourished. No distress.  HENT:  Head: Normocephalic and atraumatic.       TMs normal bilaterally Mild nasal congestion Throat w/out erythema, edema, or exudate  Eyes: Conjunctivae and EOM are normal. Pupils are equal, round, and reactive to light.  Neck: Normal range of motion. Neck supple.  Cardiovascular: Normal rate, regular rhythm, normal heart sounds and intact distal pulses.   No murmur heard. Pulmonary/Chest: Effort normal and breath sounds normal. No respiratory distress. She has no wheezes.       + hacking cough w/ deep breath or talking Minimal cough after neb tx  Lymphadenopathy:    She has no cervical adenopathy.          Assessment & Plan:

## 2011-10-07 NOTE — Patient Instructions (Signed)
This sounds like a cough-variant reactive airway which is common after illness Use the albuterol inhaler- 2 puffs as needed for cough Call with any questions or concerns Hang in there!

## 2011-10-08 NOTE — Assessment & Plan Note (Signed)
Pt's cough consistent w/ post-infectious/reactive airway.  Improved s/p neb.  Inhaler script given along w/ instructions for use.  Reviewed supportive care and red flags that should prompt return.  Pt expressed understanding and is in agreement w/ plan.

## 2011-12-02 ENCOUNTER — Other Ambulatory Visit: Payer: Self-pay | Admitting: Family Medicine

## 2011-12-03 NOTE — Telephone Encounter (Signed)
rx sent to pharmacy by e-script  

## 2012-09-30 ENCOUNTER — Ambulatory Visit (INDEPENDENT_AMBULATORY_CARE_PROVIDER_SITE_OTHER): Payer: 59 | Admitting: Family Medicine

## 2012-09-30 VITALS — BP 127/82 | HR 100 | Temp 98.4°F | Ht 67.75 in | Wt 260.2 lb

## 2012-09-30 DIAGNOSIS — J019 Acute sinusitis, unspecified: Secondary | ICD-10-CM

## 2012-09-30 DIAGNOSIS — G5620 Lesion of ulnar nerve, unspecified upper limb: Secondary | ICD-10-CM

## 2012-09-30 MED ORDER — CLARITHROMYCIN ER 500 MG PO TB24: 1000.0000 mg | ORAL_TABLET | Freq: Every day | ORAL | Status: DC

## 2012-09-30 NOTE — Patient Instructions (Addendum)
Schedule your complete physical at your convenience Start the Biaxin- 2 tabs at the same time daily.  Take w/ food Add Mucinex to thin your congestion Drink plenty of fluids REST! Hang in there!!!

## 2012-09-30 NOTE — Assessment & Plan Note (Signed)
Pt's sxs and PE consistent w/ infxn.  Start abx.  Reviewed supportive care and red flags that should prompt return.  Pt expressed understanding and is in agreement w/ plan.  

## 2012-09-30 NOTE — Progress Notes (Signed)
  Subjective:    Patient ID: Vanessa Crawford, female    DOB: 05-05-1961, 51 y.o.   MRN: [id number]  HPI URI- sxs started Sunday w/ nasal congestion, drainage.  Initially clear, then yellow now yellow-green.  + facial pressure, neck stiffness.  No ear pain.  + cough- 'croupy sounding'.  + sick contacts.  No fevers.  L forearm/hand numbness- hx of ulnar entrapment/neuropathy but pt reports this is worsening.  Having trouble w/ grip and very limited w/ lifting.  Has not seen ortho.  sxs >1 yr.  Review of Systems For ROS see HPI     Objective:   Physical Exam  Vitals reviewed. Constitutional: She appears well-developed and well-nourished. No distress.  HENT:  Head: Normocephalic and atraumatic.  Right Ear: Tympanic membrane normal.  Left Ear: Tympanic membrane normal.  Nose: Mucosal edema and rhinorrhea present. Right sinus exhibits maxillary sinus tenderness. Right sinus exhibits no frontal sinus tenderness. Left sinus exhibits maxillary sinus tenderness. Left sinus exhibits no frontal sinus tenderness.  Mouth/Throat: Uvula is midline and mucous membranes are normal. Posterior oropharyngeal erythema present. No oropharyngeal exudate.  Eyes: Conjunctivae normal and EOM are normal. Pupils are equal, round, and reactive to light.  Neck: Normal range of motion. Neck supple.  Cardiovascular: Normal rate, regular rhythm and normal heart sounds.   Pulmonary/Chest: Effort normal and breath sounds normal. No respiratory distress. She has no wheezes.  Lymphadenopathy:    She has no cervical adenopathy.          Assessment & Plan:

## 2012-09-30 NOTE — Assessment & Plan Note (Signed)
Deteriorated.  Refer to ortho for complete eval and tx

## 2013-01-15 LAB — HM PAP SMEAR: HM Pap smear: NORMAL

## 2013-01-15 LAB — HM MAMMOGRAPHY: HM Mammogram: NORMAL

## 2013-06-23 ENCOUNTER — Encounter: Payer: Self-pay | Admitting: Family Medicine

## 2013-11-03 ENCOUNTER — Other Ambulatory Visit: Payer: Self-pay | Admitting: Family Medicine

## 2013-11-03 NOTE — Telephone Encounter (Signed)
Med filled.  

## 2013-11-14 ENCOUNTER — Encounter: Payer: Self-pay | Admitting: Family Medicine

## 2013-11-14 ENCOUNTER — Ambulatory Visit (INDEPENDENT_AMBULATORY_CARE_PROVIDER_SITE_OTHER): Payer: BC Managed Care – PPO | Admitting: Family Medicine

## 2013-11-14 VITALS — BP 130/90 | HR 105 | Temp 98.2°F | Resp 16 | Wt 266.0 lb

## 2013-11-14 DIAGNOSIS — E119 Type 2 diabetes mellitus without complications: Secondary | ICD-10-CM

## 2013-11-14 DIAGNOSIS — F329 Major depressive disorder, single episode, unspecified: Secondary | ICD-10-CM

## 2013-11-14 DIAGNOSIS — I10 Essential (primary) hypertension: Secondary | ICD-10-CM

## 2013-11-14 MED ORDER — LOSARTAN POTASSIUM 100 MG PO TABS: 100.0000 mg | ORAL_TABLET | Freq: Every day | ORAL | Status: DC

## 2013-11-14 MED ORDER — FLUOXETINE HCL 20 MG PO TABS: 20.0000 mg | ORAL_TABLET | Freq: Every day | ORAL | Status: DC

## 2013-11-14 NOTE — Assessment & Plan Note (Signed)
Pt has never been on meds.  Hopes to control w/ healthy diet and regular exercise.  Has not been seen in >1 yr.  Check labs to determine baseline A1C and formulate tx plan based on results.

## 2013-11-14 NOTE — Patient Instructions (Signed)
Follow up in 4-6 weeks to recheck mood Start the Prozac daily Continue the Losartan daily for blood pressure We'll notify you of your lab results and make any changes if needed Call with any questions or concerns Welcome Back! Happy Holidays!

## 2013-11-14 NOTE — Progress Notes (Signed)
   Subjective:    Patient ID: Vanessa Crawford, female    DOB: 06/17/1961, 52 y.o.   MRN: [id number]  HPI Pre visit review using our clinic review tool, if applicable. No additional management support is needed unless otherwise documented below in the visit note.  HTN- chronic problem, on Losartan.  Has been under a lot of stress.  Just restarted the Losartan recently.  No CP.  Can feel pulse pounding in temple when BP is high.  Some SOB w/ exertion.  No visual changes, edema.  DM- chronic problem, has never been on meds.  Was attempting to control w/ healthy diet and regular exercise.  Depression- chronic problem, pt's husband was dx'd w/ stage 4 thyroid cancer earlier this year.  Is also a new grandmother and very stressed.  Tearful in office.   Review of Systems For ROS see HPI     Objective:   Physical Exam  Vitals reviewed. Constitutional: She is oriented to person, place, and time. She appears well-developed and well-nourished. No distress.  HENT:  Head: Normocephalic and atraumatic.  Eyes: Conjunctivae and EOM are normal. Pupils are equal, round, and reactive to light.  Neck: Normal range of motion. Neck supple. No thyromegaly present.  Cardiovascular: Normal rate, regular rhythm, normal heart sounds and intact distal pulses.   No murmur heard. Pulmonary/Chest: Effort normal and breath sounds normal. No respiratory distress.  Abdominal: Soft. She exhibits no distension. There is no tenderness.  Musculoskeletal: She exhibits no edema.  Lymphadenopathy:    She has no cervical adenopathy.  Neurological: She is alert and oriented to person, place, and time.  Skin: Skin is warm and dry.  Psychiatric: Judgment and thought content normal.  Tearful, anxious          Assessment & Plan:

## 2013-11-14 NOTE — Assessment & Plan Note (Signed)
Chronic problem, deteriorated.  Start low dose SSRI.  Will follow closely.

## 2013-11-14 NOTE — Assessment & Plan Note (Signed)
Chronic problem.  Pt has been off meds for almost a year.  Recently restarted Cozaar.  Check labs.  Will follow closely and adjust meds prn.

## 2013-11-15 LAB — LIPID PANEL
Cholesterol: 143 mg/dL (ref 0–200)
Triglycerides: 372 mg/dL — ABNORMAL HIGH (ref 0.0–149.0)

## 2013-11-15 LAB — CBC WITH DIFFERENTIAL/PLATELET
Basophils Relative: 0.5 % (ref 0.0–3.0)
Eosinophils Relative: 3.3 % (ref 0.0–5.0)
Lymphocytes Relative: 21.6 % (ref 12.0–46.0)
Lymphs Abs: 2 10*3/uL (ref 0.7–4.0)
MCHC: 33.5 g/dL (ref 30.0–36.0)
MCV: 87.1 fl (ref 78.0–100.0)
Neutro Abs: 6.4 10*3/uL (ref 1.4–7.7)
Neutrophils Relative %: 70 % (ref 43.0–77.0)
Platelets: 242 10*3/uL (ref 150.0–400.0)

## 2013-11-15 LAB — LDL CHOLESTEROL, DIRECT: Direct LDL: 67.9 mg/dL

## 2013-11-15 LAB — HEPATIC FUNCTION PANEL
Albumin: 4.1 g/dL (ref 3.5–5.2)
Total Protein: 7.5 g/dL (ref 6.0–8.3)

## 2013-11-15 LAB — BASIC METABOLIC PANEL
BUN: 14 mg/dL (ref 6–23)
CO2: 21 mEq/L (ref 19–32)
Calcium: 9.6 mg/dL (ref 8.4–10.5)
Creatinine, Ser: 0.9 mg/dL (ref 0.4–1.2)
Glucose, Bld: 129 mg/dL — ABNORMAL HIGH (ref 70–99)

## 2013-11-17 ENCOUNTER — Other Ambulatory Visit: Payer: Self-pay | Admitting: General Practice

## 2013-11-17 MED ORDER — FENOFIBRATE 160 MG PO TABS: 160.0000 mg | ORAL_TABLET | Freq: Every day | ORAL | Status: DC

## 2013-11-21 ENCOUNTER — Telehealth: Payer: Self-pay | Admitting: *Deleted

## 2013-11-21 NOTE — Telephone Encounter (Signed)
LM @ (5:07pm) asking the pt to RTC regarding recent lab results.//AB/CMA

## 2013-11-21 NOTE — Telephone Encounter (Signed)
Message copied by Verdie Shire on Mon Nov 21, 2013  5:07 PM ------      Message from: Sheliah Hatch      Created: Tue Nov 15, 2013  8:27 PM       A1C means pt is officially diabetic.  Based on this, she really needs to work on low carb diet and regular exercise.  Trigs are also much higher than last check- based on this, needs to start Fenofibrate 160mg  daily and work on healthy diet and regular exercise. ------

## 2013-11-25 ENCOUNTER — Encounter: Payer: Self-pay | Admitting: General Practice

## 2013-12-02 NOTE — Telephone Encounter (Signed)
Pt was informed of results by letter on (11-25-13).//AB/CMA

## 2013-12-20 ENCOUNTER — Other Ambulatory Visit: Payer: Self-pay | Admitting: Family Medicine

## 2013-12-21 NOTE — Telephone Encounter (Signed)
Med filled.  

## 2013-12-26 ENCOUNTER — Encounter: Payer: Self-pay | Admitting: Family Medicine

## 2013-12-26 ENCOUNTER — Ambulatory Visit (INDEPENDENT_AMBULATORY_CARE_PROVIDER_SITE_OTHER): Payer: BC Managed Care – PPO | Admitting: Family Medicine

## 2013-12-26 VITALS — BP 120/78 | HR 95 | Temp 98.2°F | Resp 16 | Wt 265.2 lb

## 2013-12-26 DIAGNOSIS — F329 Major depressive disorder, single episode, unspecified: Secondary | ICD-10-CM

## 2013-12-26 DIAGNOSIS — E781 Pure hyperglyceridemia: Secondary | ICD-10-CM | POA: Insufficient documentation

## 2013-12-26 DIAGNOSIS — E119 Type 2 diabetes mellitus without complications: Secondary | ICD-10-CM

## 2013-12-26 DIAGNOSIS — G4733 Obstructive sleep apnea (adult) (pediatric): Secondary | ICD-10-CM

## 2013-12-26 DIAGNOSIS — F3289 Other specified depressive episodes: Secondary | ICD-10-CM

## 2013-12-26 NOTE — Progress Notes (Signed)
   Subjective:    Patient ID: Vanessa Crawford, female    DOB: 1961/05/04, 53 y.o.   MRN: [id number]  HPI Depression- new dx at last visit.  Started on Prozac at last visit and feels this is very helpful.  Mood has improved, less tearful, less quick to anger, sleeping better.  Hypertriglyceridemia- started on Fenofibrate at last visit.  Had recent biometric screening at work and while trigs were still elevated at 260, this was about 100 points lower than previous.  DM- new dx at last visit.  Renewed [employer] membership, goal is 3x/week.  Pt plans to modify diet to low iodine diet (limiting potatoes and other starches)   Review of Systems For ROS see HPI     Objective:   Physical Exam  Vitals reviewed. Constitutional: She is oriented to person, place, and time. She appears well-developed and well-nourished. No distress.  HENT:  Head: Normocephalic and atraumatic.  Cardiovascular: Normal rate, regular rhythm, normal heart sounds and intact distal pulses.   Pulmonary/Chest: Effort normal and breath sounds normal. No respiratory distress. She has no wheezes. She has no rales.  Abdominal: Soft. Bowel sounds are normal. She exhibits no distension. There is no tenderness. There is no rebound.  Neurological: She is alert and oriented to person, place, and time.  Skin: Skin is warm and dry.  Psychiatric: She has a normal mood and affect. Her behavior is normal. Thought content normal.          Assessment & Plan:

## 2013-12-26 NOTE — Patient Instructions (Signed)
Follow up in early March to recheck diabetes and cholesterol Continue the Prozac daily I'm so proud of the changes your making! You can totally do the exercise and healthy diet! Call with any questions or concerns Happy New Year!!!

## 2013-12-27 ENCOUNTER — Telehealth: Payer: Self-pay

## 2013-12-27 NOTE — Telephone Encounter (Signed)
Relevant patient education assigned to patient using Emmi. ° °

## 2013-12-27 NOTE — Assessment & Plan Note (Signed)
New dx at last visit.  Not currently on meds b/c pt is attempting to control w/ diet and exercise.  Has renewed gym membership.  Plans to start new diet today.  Will follow.

## 2013-12-27 NOTE — Assessment & Plan Note (Signed)
New problem.  Noted on last labs.  Tolerating fenofibrate w/o difficulty.  Will repeat labs at next OV to assess if adequate reduction.  According to recent POC testing at work screening, trigs have decreased by ~100 points.  Will follow.

## 2013-12-27 NOTE — Assessment & Plan Note (Signed)
Improved since starting Prozac at last OV.  Pt feels dose is appropriate, no changes at this time.

## 2013-12-27 NOTE — Assessment & Plan Note (Signed)
Script given for new equipment

## 2014-02-15 ENCOUNTER — Encounter: Payer: Self-pay | Admitting: Family Medicine

## 2014-02-15 ENCOUNTER — Ambulatory Visit (INDEPENDENT_AMBULATORY_CARE_PROVIDER_SITE_OTHER): Payer: BC Managed Care – PPO | Admitting: Family Medicine

## 2014-02-15 VITALS — BP 122/78 | HR 92 | Temp 98.2°F | Resp 16 | Wt 272.0 lb

## 2014-02-15 DIAGNOSIS — E119 Type 2 diabetes mellitus without complications: Secondary | ICD-10-CM

## 2014-02-15 DIAGNOSIS — E781 Pure hyperglyceridemia: Secondary | ICD-10-CM

## 2014-02-15 DIAGNOSIS — I10 Essential (primary) hypertension: Secondary | ICD-10-CM

## 2014-02-15 DIAGNOSIS — M545 Low back pain, unspecified: Secondary | ICD-10-CM | POA: Insufficient documentation

## 2014-02-15 MED ORDER — CYCLOBENZAPRINE HCL 10 MG PO TABS: 10.0000 mg | ORAL_TABLET | Freq: Three times a day (TID) | ORAL | Status: DC | PRN

## 2014-02-15 MED ORDER — NAPROXEN 500 MG PO TABS: 500.0000 mg | ORAL_TABLET | Freq: Two times a day (BID) | ORAL | Status: AC

## 2014-02-15 NOTE — Patient Instructions (Signed)
Follow up in 3-4 months to recheck diabetes We'll notify you of your lab results and make any changes if needed Try and get regular exercise and make healthy food choices Limit your carbs! Start the Naproxen- twice daily w/ food- for 7-10 days and then as needed Use the flexeril as needed for spasm- may cause drowsiness HEAT! Call with any questions or concerns Hang in there!

## 2014-02-15 NOTE — Assessment & Plan Note (Signed)
Pt was supposed to work on healthy diet and regular exercise after being officially dx'd w/ DM but pt has gained weight since then and admits that she has not been doing the right things.  Check labs.  Start meds prn.

## 2014-02-15 NOTE — Assessment & Plan Note (Signed)
New.  Muscular in origin.  Start scheduled NSAIDs, flexeril prn, heat, gentle stretching.  Reviewed supportive care and red flags that should prompt return.  Pt expressed understanding and is in agreement w/ plan.

## 2014-02-15 NOTE — Assessment & Plan Note (Signed)
Chronic problem.  Adequate control.  Asymptomatic.  Check labs.  No anticipated med changes 

## 2014-02-15 NOTE — Progress Notes (Signed)
Pre visit review using our clinic review tool, if applicable. No additional management support is needed unless otherwise documented below in the visit note. 

## 2014-02-15 NOTE — Assessment & Plan Note (Signed)
Noted on last labs.  Started on fenofibrate.  Check labs.  Adjust meds prn

## 2014-02-15 NOTE — Progress Notes (Signed)
   Subjective:    Patient ID: Vanessa Crawford, female    DOB: 1961-09-12, 53 y.o.   MRN: [id number]  HPI DM- noted at last visit.  Not currently on meds.  Attempting to control w/ low carb diet.  Pt admits she has not done well w/ diet.  On ARB.  Currently unable to exercise due to back pain.  HTN- chronic problem, well controlled on Losartan daily.  No CP, SOB, HAs, visual changes, edema.  Hyperlipidemia- noted to have high trigs at last visit.  Started on fenofibrate.  No N/V, abd pain, myalgias.  Back pain- sxs started Saturday afternoon, Sunday was unable to sleep, roll over.  Husband needed to help get her out of bed.  No known injury.  No heavy lifting.  Pain is lumbar and radiates across low back.  No radiation into buttock or legs.  No numbness or weakness.     Review of Systems For ROS see HPI     Objective:   Physical Exam  Vitals reviewed. Constitutional: She is oriented to person, place, and time. She appears well-developed and well-nourished. No distress.  obese  HENT:  Head: Normocephalic and atraumatic.  Eyes: Conjunctivae and EOM are normal. Pupils are equal, round, and reactive to light.  Neck: Normal range of motion. Neck supple. No thyromegaly present.  Cardiovascular: Normal rate, regular rhythm, normal heart sounds and intact distal pulses.   No murmur heard. Pulmonary/Chest: Effort normal and breath sounds normal. No respiratory distress.  Abdominal: Soft. She exhibits no distension. There is no tenderness.  Musculoskeletal: She exhibits tenderness (over lumbar paraspinal muscles bilaterally). She exhibits no edema.  Pain w/ back flexion>extension  Lymphadenopathy:    She has no cervical adenopathy.  Neurological: She is alert and oriented to person, place, and time.  Skin: Skin is warm and dry.  Psychiatric: She has a normal mood and affect. Her behavior is normal.          Assessment & Plan:

## 2014-02-16 ENCOUNTER — Encounter: Payer: Self-pay | Admitting: General Practice

## 2014-02-16 ENCOUNTER — Other Ambulatory Visit: Payer: Self-pay | Admitting: Gynecology

## 2014-02-16 LAB — LIPID PANEL
CHOLESTEROL: 172 mg/dL (ref 0–200)
HDL: 22.3 mg/dL — AB (ref >39.00)
LDL Cholesterol: 99 mg/dL (ref 0–99)
TRIGLYCERIDES: 254 mg/dL — AB (ref 0.0–149.0)
Total CHOL/HDL Ratio: 8
VLDL: 50.8 mg/dL — AB (ref 0.0–40.0)

## 2014-02-16 LAB — BASIC METABOLIC PANEL
BUN: 14 mg/dL (ref 6–23)
CO2: 20 mEq/L (ref 19–32)
Calcium: 8.6 mg/dL (ref 8.4–10.5)
Chloride: 106 mEq/L (ref 96–112)
Creatinine, Ser: 1 mg/dL (ref 0.4–1.2)
GFR: 63.94 mL/min (ref >60.00)
Glucose, Bld: 132 mg/dL — ABNORMAL HIGH (ref 70–99)
Potassium: 3.9 mEq/L (ref 3.5–5.1)
SODIUM: 132 meq/L — AB (ref 135–145)

## 2014-02-16 LAB — HEPATIC FUNCTION PANEL
ALT: 23 U/L (ref 0–35)
AST: 16 U/L (ref 0–37)
Albumin: 3.7 g/dL (ref 3.5–5.2)
Alkaline Phosphatase: 49 U/L (ref 39–117)
Bilirubin, Direct: 0.1 mg/dL (ref 0.0–0.3)
TOTAL PROTEIN: 6.6 g/dL (ref 6.0–8.3)
Total Bilirubin: 0.6 mg/dL (ref 0.3–1.2)

## 2014-02-16 LAB — HEMOGLOBIN A1C: HEMOGLOBIN A1C: 7.1 % — AB (ref 4.6–6.5)

## 2014-03-25 ENCOUNTER — Other Ambulatory Visit: Payer: Self-pay | Admitting: Family Medicine

## 2014-03-27 NOTE — Telephone Encounter (Signed)
Med filled.  

## 2014-06-23 ENCOUNTER — Other Ambulatory Visit: Payer: Self-pay | Admitting: General Practice

## 2014-06-23 MED ORDER — FENOFIBRATE 160 MG PO TABS: ORAL_TABLET | ORAL | Status: DC

## 2014-06-23 MED ORDER — FLUOXETINE HCL 20 MG PO TABS: ORAL_TABLET | ORAL | Status: DC

## 2014-07-11 ENCOUNTER — Telehealth: Payer: Self-pay | Admitting: Family Medicine

## 2014-07-11 MED ORDER — VALACYCLOVIR HCL 500 MG PO TABS: 500.0000 mg | ORAL_TABLET | Freq: Two times a day (BID) | ORAL | Status: DC

## 2014-07-11 NOTE — Telephone Encounter (Signed)
Caller name: Gavin PoundDeborah Relation to pt: self Call back number: [phone] Pharmacy: [facility] [address]   Reason for call: pt needs re-fill on valACYclovir (VALTREX) 500 MG tablet

## 2014-07-11 NOTE — Telephone Encounter (Signed)
Medication filled will notify pt.  

## 2014-07-19 ENCOUNTER — Other Ambulatory Visit: Payer: Self-pay | Admitting: Gynecology

## 2014-07-20 LAB — CYTOLOGY - PAP

## 2014-11-23 ENCOUNTER — Encounter: Payer: Self-pay | Admitting: Family Medicine

## 2014-11-23 ENCOUNTER — Ambulatory Visit (INDEPENDENT_AMBULATORY_CARE_PROVIDER_SITE_OTHER): Payer: BC Managed Care – PPO | Admitting: Family Medicine

## 2014-11-23 VITALS — BP 124/80 | HR 106 | Temp 98.3°F | Resp 16 | Wt 274.0 lb

## 2014-11-23 DIAGNOSIS — R102 Pelvic and perineal pain: Secondary | ICD-10-CM

## 2014-11-23 DIAGNOSIS — L989 Disorder of the skin and subcutaneous tissue, unspecified: Secondary | ICD-10-CM

## 2014-11-23 DIAGNOSIS — E119 Type 2 diabetes mellitus without complications: Secondary | ICD-10-CM

## 2014-11-23 DIAGNOSIS — I10 Essential (primary) hypertension: Secondary | ICD-10-CM

## 2014-11-23 DIAGNOSIS — E781 Pure hyperglyceridemia: Secondary | ICD-10-CM

## 2014-11-23 DIAGNOSIS — L74510 Primary focal hyperhidrosis, axilla: Secondary | ICD-10-CM

## 2014-11-23 MED ORDER — ALUMINUM CHLORIDE 20 % EX SOLN: Freq: Every day | CUTANEOUS | Status: DC

## 2014-11-23 MED ORDER — TRIAMCINOLONE ACETONIDE 0.1 % EX OINT: 1.0000 "application " | TOPICAL_OINTMENT | Freq: Two times a day (BID) | CUTANEOUS | Status: DC

## 2014-11-23 NOTE — Patient Instructions (Signed)
Follow up in 3-4 months to recheck diabetes We'll notify you of your lab results and your CT scan Start the Drysol nightly at bedtime until the excessive sweating stops and then use 1-2x/week Use the Triamcinolone twice daily on the spots on your arm Try and make healthy food choices and get regular exercise Call with any questions or concerns Happy Holidays!!!

## 2014-11-23 NOTE — Progress Notes (Signed)
   Subjective:    Patient ID: Vanessa Crawford, female    DOB: Apr 19, 1961, 53 y.o.   MRN: [id number]  HPI abd pain- RLQ, 'it felt like i have a cyst on my ovary'.  Went to GYN and 'got that all checked out'.  Pain w/ walking, lying on R side, some nausea.  Pain will radiate up into abd from groin.  No known lump.  Pain started 4 weeks ago.  No recent change in activity or heavy lifting.  HTN- chronic problem, on Losartan.  Denies CP, SOB, HAs, visual changes, edema.  DM- A1C 7.1 on last labs.  Not currently on meds.  Not following diet or getting regular exercise.  On ARB for renal protection.  Hyperlipidemia- chronic problem, on Fenofibrate.  Due for labs.  Excessive sweating- pt reports she is sweating through her clothes in her armpits.  Using clinical strength OTC deodorants w/o improvement.  Skin lesions- scattered lesions on arms.  Dry, itchy, red.  Not present elsewhere on body.   Review of Systems For ROS see HPI     Objective:   Physical Exam  Constitutional: She is oriented to person, place, and time. She appears well-developed and well-nourished. No distress.  HENT:  Head: Normocephalic and atraumatic.  Eyes: Conjunctivae and EOM are normal. Pupils are equal, round, and reactive to light.  Neck: Normal range of motion. Neck supple. No thyromegaly present.  Cardiovascular: Normal rate, regular rhythm, normal heart sounds and intact distal pulses.   No murmur heard. Pulmonary/Chest: Effort normal and breath sounds normal. No respiratory distress.  Abdominal: Soft. Bowel sounds are normal. She exhibits no distension and no mass. There is tenderness (R pelvic pain- particularly in pannus). There is no rebound and no guarding.  Musculoskeletal: She exhibits no edema.  Lymphadenopathy:    She has no cervical adenopathy.  Neurological: She is alert and oriented to person, place, and time.  Skin: Skin is warm and dry.  Psychiatric: She has a normal mood and affect. Her  behavior is normal.  Vitals reviewed.         Assessment & Plan:

## 2014-11-23 NOTE — Progress Notes (Signed)
Pre visit review using our clinic review tool, if applicable. No additional management support is needed unless otherwise documented below in the visit note. 

## 2014-11-24 LAB — CBC WITH DIFFERENTIAL/PLATELET
BASOS PCT: 2 % (ref 0.0–3.0)
Basophils Absolute: 0.1 10*3/uL (ref 0.0–0.1)
EOS ABS: 0.3 10*3/uL (ref 0.0–0.7)
Eosinophils Relative: 4.2 % (ref 0.0–5.0)
HEMATOCRIT: 39.4 % (ref 36.0–46.0)
HEMOGLOBIN: 13.1 g/dL (ref 12.0–15.0)
LYMPHS ABS: 2 10*3/uL (ref 0.7–4.0)
Lymphocytes Relative: 29.5 % (ref 12.0–46.0)
MCHC: 33.3 g/dL (ref 30.0–36.0)
MCV: 87.3 fl (ref 78.0–100.0)
MONO ABS: 0.4 10*3/uL (ref 0.1–1.0)
Monocytes Relative: 5.2 % (ref 3.0–12.0)
NEUTROS ABS: 4.1 10*3/uL (ref 1.4–7.7)
Neutrophils Relative %: 59.1 % (ref 43.0–77.0)
Platelets: 246 10*3/uL (ref 150.0–400.0)
RBC: 4.51 Mil/uL (ref 3.87–5.11)
RDW: 14.3 % (ref 11.5–15.5)
WBC: 6.9 10*3/uL (ref 4.0–10.5)

## 2014-11-24 LAB — LIPID PANEL
Cholesterol: 193 mg/dL (ref 0–200)
HDL: 25.4 mg/dL — ABNORMAL LOW (ref >39.00)
NONHDL: 167.6
Total CHOL/HDL Ratio: 8
Triglycerides: 303 mg/dL — ABNORMAL HIGH (ref 0.0–149.0)
VLDL: 60.6 mg/dL — AB (ref 0.0–40.0)

## 2014-11-24 LAB — HEPATIC FUNCTION PANEL
ALBUMIN: 4.1 g/dL (ref 3.5–5.2)
ALT: 39 U/L — ABNORMAL HIGH (ref 0–35)
AST: 21 U/L (ref 0–37)
Alkaline Phosphatase: 70 U/L (ref 39–117)
BILIRUBIN TOTAL: 0.4 mg/dL (ref 0.2–1.2)
Bilirubin, Direct: 0.1 mg/dL (ref 0.0–0.3)
Total Protein: 7.2 g/dL (ref 6.0–8.3)

## 2014-11-24 LAB — BASIC METABOLIC PANEL
BUN: 18 mg/dL (ref 6–23)
CO2: 19 mEq/L (ref 19–32)
Calcium: 9.2 mg/dL (ref 8.4–10.5)
Chloride: 107 mEq/L (ref 96–112)
Creatinine, Ser: 1.2 mg/dL (ref 0.4–1.2)
GFR: 51.86 mL/min — ABNORMAL LOW (ref >60.00)
GLUCOSE: 204 mg/dL — AB (ref 70–99)
POTASSIUM: 4.5 meq/L (ref 3.5–5.1)
Sodium: 133 mEq/L — ABNORMAL LOW (ref 135–145)

## 2014-11-24 LAB — TSH: TSH: 1.6 u[IU]/mL (ref 0.35–4.50)

## 2014-11-24 LAB — HEMOGLOBIN A1C: HEMOGLOBIN A1C: 8 % — AB (ref 4.6–6.5)

## 2014-11-24 LAB — LDL CHOLESTEROL, DIRECT: Direct LDL: 116.9 mg/dL

## 2014-11-25 ENCOUNTER — Other Ambulatory Visit: Payer: Self-pay | Admitting: Family Medicine

## 2014-11-26 NOTE — Assessment & Plan Note (Signed)
Chronic problem.  Pt is attempting to control w/ diet and exercise but admits to doing poorly on both.  On ARB for renal protection.  Pt has not followed up as recommended.  Check labs and start meds as needed.

## 2014-11-26 NOTE — Assessment & Plan Note (Signed)
Chronic problem.  Tolerating ARB w/o difficulty.  Check labs.  No anticipated med changes.

## 2014-11-26 NOTE — Assessment & Plan Note (Signed)
New. Start topical Drysol.  Reviewed appropriate use and application.  Will follow.

## 2014-11-26 NOTE — Assessment & Plan Note (Signed)
New.  Pt has had pain for ~4 weeks.  Now having difficulty lying on R side, rolling over, and bearing weight due to radiation of pain up into abdomen.  Pt had negative GYN evaluation.  Will get labs and also CT to assess and try to r/o infxn, hernia, or other process.  Reviewed supportive care and red flags that should prompt return.  Pt expressed understanding and is in agreement w/ plan.

## 2014-11-26 NOTE — Assessment & Plan Note (Signed)
New.  Start topical steroid.  Will follow.

## 2014-11-26 NOTE — Assessment & Plan Note (Signed)
Chronic problem.  Tolerating fenofibrate.  Pt admits to poor diet and little to no exercise.  Check labs.  Encouraged pt to start eating better and exercising.

## 2014-11-27 ENCOUNTER — Other Ambulatory Visit: Payer: Self-pay | Admitting: General Practice

## 2014-11-27 DIAGNOSIS — E119 Type 2 diabetes mellitus without complications: Secondary | ICD-10-CM

## 2014-11-27 MED ORDER — METFORMIN HCL 500 MG PO TABS: 500.0000 mg | ORAL_TABLET | Freq: Two times a day (BID) | ORAL | Status: DC

## 2014-11-27 NOTE — Telephone Encounter (Signed)
Med filled.  

## 2014-11-28 ENCOUNTER — Other Ambulatory Visit: Payer: Self-pay | Admitting: Family Medicine

## 2014-11-28 ENCOUNTER — Telehealth: Payer: Self-pay | Admitting: Family Medicine

## 2014-11-28 ENCOUNTER — Telehealth: Payer: Self-pay | Admitting: General Practice

## 2014-11-28 ENCOUNTER — Other Ambulatory Visit: Payer: Self-pay | Admitting: General Practice

## 2014-11-28 DIAGNOSIS — R102 Pelvic and perineal pain: Secondary | ICD-10-CM

## 2014-11-28 DIAGNOSIS — R1011 Right upper quadrant pain: Secondary | ICD-10-CM

## 2014-11-28 NOTE — Telephone Encounter (Signed)
Called and spoke with chris, coding with US of abdomen will be changed to RUQ pain. To cover with insurance.

## 2014-11-28 NOTE — Telephone Encounter (Signed)
Radiology department called stating in need of clarification of orders. Please call ext [phone] tech area and [facility]

## 2014-11-28 NOTE — Telephone Encounter (Signed)
Orders placed and ct cancelled.

## 2014-11-28 NOTE — Telephone Encounter (Signed)
-----   Message from Sheliah HatchKatherine E Tabori, MD sent at 11/28/2014  9:19 AM EST ----- Please convert this to phone note- insurance declines CT of abd/pelvis and indicates pt needs complete abd US and pelvic US.  Please enter these orders (dx of R pelvic pain) and cancel CT as this was declined.  ----- Message -----    From: Oneal GroutJennifer S Sebastian    Sent: 11/24/2014   9:30 AM      To: Sheliah HatchKatherine E Tabori, MD  Insurance will not approve CT Abd/Pelvic, will need to peer to peer review. Insurance ID # [id number] call [phone] Thanks

## 2014-11-30 ENCOUNTER — Ambulatory Visit (HOSPITAL_BASED_OUTPATIENT_CLINIC_OR_DEPARTMENT_OTHER): Payer: BC Managed Care – PPO

## 2014-11-30 ENCOUNTER — Ambulatory Visit (HOSPITAL_BASED_OUTPATIENT_CLINIC_OR_DEPARTMENT_OTHER)
Admission: RE | Admit: 2014-11-30 | Discharge: 2014-11-30 | Disposition: A | Discharge status: Home / Self Care | Payer: BC Managed Care – PPO | Source: Ambulatory Visit | Attending: Family Medicine | Admitting: Family Medicine

## 2014-11-30 ENCOUNTER — Telehealth: Payer: Self-pay | Admitting: Family Medicine

## 2014-11-30 DIAGNOSIS — R102 Pelvic and perineal pain: Secondary | ICD-10-CM

## 2014-11-30 DIAGNOSIS — R1011 Right upper quadrant pain: Secondary | ICD-10-CM | POA: Diagnosis present

## 2014-11-30 NOTE — Telephone Encounter (Signed)
Caller name:Sharon  From [facility] (downstairs)   Reason for call: Jasmine DecemberSharon had pt at office with referral from Dr. Beverely Lowabori stating that needed imaging for pelvis. Pt had states to Jasmine DecemberSharon that already had one done 2 wks ago. Pt stated that insurance will not cover visit since already done 2 wks and also stated nothing was seen in imaging results. Jasmine DecemberSharon states that it will not be needed to be done again. Please advice.

## 2014-12-01 ENCOUNTER — Other Ambulatory Visit: Payer: Self-pay | Admitting: Family Medicine

## 2014-12-01 ENCOUNTER — Telehealth: Payer: Self-pay | Admitting: Family Medicine

## 2014-12-01 DIAGNOSIS — K8050 Calculus of bile duct without cholangitis or cholecystitis without obstruction: Secondary | ICD-10-CM

## 2014-12-01 NOTE — Telephone Encounter (Signed)
Noted will advise tabori.

## 2014-12-01 NOTE — Telephone Encounter (Signed)
Will notify pt as soon as one of the provider comments on the results in Dr. Melvia Heaps's in basket.

## 2014-12-01 NOTE — Telephone Encounter (Signed)
Pt is inquiring about test results

## 2014-12-02 NOTE — Telephone Encounter (Signed)
See if Vanessa Crawford can try again w/ the originally ordered CT abd/pelvis.  Insurance denied our initial order, saying we needed the US first.  Clearly they don't know what she has had done despite having access to her claims.  This is very frustrating for both the pt and us.  Please give our apologies.

## 2014-12-04 NOTE — Telephone Encounter (Signed)
We do not have the results, correct? Insurance will not approve without results. Please advise.

## 2014-12-04 NOTE — Telephone Encounter (Signed)
Pt states Pelvis US was done @ Dr [facility], contacted their office, awaiting results to be faxed

## 2014-12-04 NOTE — Telephone Encounter (Signed)
I do not have results- she will need to have GYN fax them or at least provide name and # of GYN so we can contact them and get records

## 2014-12-05 ENCOUNTER — Telehealth: Payer: Self-pay | Admitting: Family Medicine

## 2014-12-05 NOTE — Telephone Encounter (Signed)
Results were received. JG/CMA

## 2014-12-05 NOTE — Telephone Encounter (Signed)
Caller name: Higinio RogerRoberts, Tahesha S Relation to pt: self  Call back number: [phone]   Reason for call:  Pt would like to know if Dr. Beverely Lowabori received labs results from Dr. Leatha GildingHoward C. Mezer, MD Obstetrician-Gynecologist. Pt states she is pain and in need of clinical advice

## 2014-12-06 ENCOUNTER — Telehealth: Payer: Self-pay | Admitting: Family Medicine

## 2014-12-06 MED ORDER — TRAMADOL HCL 50 MG PO TABS: 50.0000 mg | ORAL_TABLET | Freq: Three times a day (TID) | ORAL | Status: DC | PRN

## 2014-12-06 NOTE — Telephone Encounter (Signed)
Was able to review pelvic US done by GYN (Dr Chevis PrettyMezer).  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  She told me that GYN informed her that 'everything was clear'.  This would indicate that she could have a GYN cause for her pain.  I would recommend she call their office to determine next steps.

## 2014-12-06 NOTE — Telephone Encounter (Signed)
Called pt and notified. She states that she will call and schedule an appt and speak with a nurse to notify obout the anednomyosis. Med filled.

## 2014-12-06 NOTE — Telephone Encounter (Signed)
Also, pt may have Tramadol 50mg  TID prn pain, #30, until she is able to see GYN

## 2014-12-06 NOTE — Telephone Encounter (Signed)
I don't have a dx b/c I don't have any imaging studies to review.  Please ask pt if her sxs have changed or worsened as we are waiting on her CT to be approved by insurance

## 2014-12-06 NOTE — Telephone Encounter (Signed)
Caller name: Jetty DuhamelRoberts, Saffron Relation to ZO:XWRUpt:self Call back [phone] Pharmacy:  Reason for call: pt would like a call back from dr. Beverely Lowabori, states she would like to discuss what exactly she decided on what her dx is and also to discuss getting something for pain

## 2014-12-12 ENCOUNTER — Telehealth: Payer: Self-pay | Admitting: Family Medicine

## 2014-12-12 DIAGNOSIS — R102 Pelvic and perineal pain: Secondary | ICD-10-CM

## 2014-12-12 DIAGNOSIS — N809 Endometriosis, unspecified: Principal | ICD-10-CM

## 2014-12-12 DIAGNOSIS — N8003 Adenomyosis of the uterus: Secondary | ICD-10-CM

## 2014-12-12 NOTE — Telephone Encounter (Signed)
Caller name: Alaija Relation to pt: self Call back number: [phone] Pharmacy:  Reason for call:   Patient states that she heard from her gynocologist and they told her that this is common. Patient wants to know what she should do next.

## 2014-12-13 ENCOUNTER — Encounter: Payer: Self-pay | Admitting: Family Medicine

## 2014-12-13 NOTE — Telephone Encounter (Signed)
Spoke with pt she wants a 2nd opinion, [facility]. Would also like to stay in [place] area. Referral placed.

## 2014-12-13 NOTE — Telephone Encounter (Signed)
If pt is still having pain, would recommend 2nd opinion from GYN due to presence of adenomyosis and presence of cysts

## 2014-12-22 ENCOUNTER — Encounter: Payer: Self-pay | Admitting: Medical

## 2014-12-22 ENCOUNTER — Telehealth: Payer: Self-pay | Admitting: Family Medicine

## 2014-12-22 ENCOUNTER — Ambulatory Visit (INDEPENDENT_AMBULATORY_CARE_PROVIDER_SITE_OTHER): Payer: BLUE CROSS/BLUE SHIELD | Admitting: Medical

## 2014-12-22 VITALS — BP 136/84 | HR 101 | Temp 97.9°F | Ht 68.0 in | Wt 275.2 lb

## 2014-12-22 DIAGNOSIS — R109 Unspecified abdominal pain: Secondary | ICD-10-CM | POA: Insufficient documentation

## 2014-12-22 DIAGNOSIS — R1084 Generalized abdominal pain: Secondary | ICD-10-CM

## 2014-12-22 DIAGNOSIS — R82998 Other abnormal findings in urine: Secondary | ICD-10-CM | POA: Insufficient documentation

## 2014-12-22 DIAGNOSIS — N390 Urinary tract infection, site not specified: Secondary | ICD-10-CM

## 2014-12-22 LAB — COMPREHENSIVE METABOLIC PANEL
ALBUMIN: 4.2 g/dL (ref 3.5–5.2)
ALT: 32 U/L (ref 0–35)
AST: 17 U/L (ref 0–37)
Alkaline Phosphatase: 69 U/L (ref 39–117)
BILIRUBIN TOTAL: 0.4 mg/dL (ref 0.2–1.2)
BUN: 15 mg/dL (ref 6–23)
CHLORIDE: 103 meq/L (ref 96–112)
CO2: 23 meq/L (ref 19–32)
Calcium: 9.8 mg/dL (ref 8.4–10.5)
Creat: 1.05 mg/dL (ref 0.50–1.10)
GLUCOSE: 163 mg/dL — AB (ref 70–99)
POTASSIUM: 4 meq/L (ref 3.5–5.3)
SODIUM: 135 meq/L (ref 135–145)
Total Protein: 6.9 g/dL (ref 6.0–8.3)

## 2014-12-22 LAB — POCT URINALYSIS DIPSTICK
GLUCOSE UA: NEGATIVE
KETONES UA: NEGATIVE
Nitrite, UA: NEGATIVE
SPEC GRAV UA: 1.015
UROBILINOGEN UA: 0.2
pH, UA: 7

## 2014-12-22 LAB — LIPASE: Lipase: 50 U/L (ref 0–75)

## 2014-12-22 MED ORDER — CIPROFLOXACIN HCL 500 MG PO TABS: 500.0000 mg | ORAL_TABLET | Freq: Two times a day (BID) | ORAL | Status: DC

## 2014-12-22 NOTE — Telephone Encounter (Signed)
Caller name: Vanessa Crawford, Payge S Relation to pt: self  Call back number: [phone]   Reason for call:  Pt would like to inform MD experiencing abdomin pain and urine is dark  in need of clinical advice. Pt has not seen the specialist MD referred because the appointment is to far out. Please advise

## 2014-12-22 NOTE — Progress Notes (Signed)
Pre visit review using our clinic review tool, if applicable. No additional management support is needed unless otherwise documented below in the visit note. 

## 2014-12-22 NOTE — Patient Instructions (Addendum)
For your long hx of abdomen pain(rlq, ruq, and some back pain recently now), I am prescribing cipro antibiotic and continue your tramadol if needed.   Follow up with specialiast  who will be evaluating your gallbladder and your gyn to evaluate for endometrial compications.   This late in the day 5 pm on Friday, it is difficult/impossible  to do full work up in timely manner. If you pain worsens or symptoms change then ED evaluation. You may need ct abd/pelvis since that has not been done yet.  Labs done today may be back this weekend or by Monday. We will call you with the results.  Please give us update on Monday how you feel. And follow up with your specialist.   I am sending urine out for culture and putting her on cipro pending the results.

## 2014-12-22 NOTE — Telephone Encounter (Signed)
Information given to E.Saguier for review. Patient being seen today.

## 2014-12-22 NOTE — Assessment & Plan Note (Signed)
For your long hx of abdomen pain(rlq, ruq, and some back pain recently now), I am prescribing cipro antibiotic and continue your tramadol if needed.   Follow up with specialiast  who will be evaluating your gallbladder and your gyn to evaluate for endometrial compications.   This late in the day 5 pm on Friday, it is difficult/impossible  to do full work up in timely manner. If you pain worsens or symptoms change then ED evaluation. You may need ct abd/pelvis since that has not been done yet.  Labs done today may be back this weekend or by Monday. We will call you with the results.  Please give us update on Monday how you feel. And follow up with your specialist.

## 2014-12-22 NOTE — Progress Notes (Signed)
Subjective:    Patient ID: Vanessa Crawford, female    DOB: 04/09/1961, 54 y.o.   MRN: [id number]  HPI   Pt in with some history of abdominal pain. She is being referred to gyn and some rt side back pain in the past but some more prominent yesterday. Pt may have issue of gallbladder disease(but she is not sure exactly what her issue is). And has some endometriosis pentrated wall of the uterus. Some occasional nausea. Pt level of pain 7/10. But this is same level of pain that she is experiening since second week of November. Pt has tramadol available for pain. No fever, no chills or vomiting. Pt had some [place] of her abdomen but she states her insurance did not approve any CT studies.  Yesterday her urine was real dark but now has lightened up. She still has some faint rt cva area pain but no associated urinary symptoms.     Past Medical History  Diagnosis Date  . Depression   . Hypertension   . Allergy     RHINITIS  . Endometriosis     History   Social History  . Marital Status: Married    Spouse Name: N/A    Number of Children: 2  . Years of Education: N/A   Occupational History  . TEAM MANAGER    Social History Main Topics  . Smoking status: Never Smoker   . Smokeless tobacco: Not on file  . Alcohol Use: No  . Drug Use: No  . Sexual Activity: Not on file   Other Topics Concern  . Not on file   Social History Narrative   REG EXERCISE          Past Surgical History  Procedure Laterality Date  . Septoplasty    . Tonsillectomy      Family History  Problem Relation Age of Onset  . Alzheimer's disease Father   . Hypertension Father   . Alzheimer's disease Maternal Grandmother   . Cancer Paternal Grandmother     Allergies  Allergen Reactions  . Aspirin     REACTION: GI upset  . Penicillins     REACTION: unknown rxn to generic pcn  . Sulfonamide Derivatives     REACTION: unknown reaction    Current Outpatient Prescriptions on File Prior to Visit    Medication Sig Dispense Refill  . aluminum chloride (DRYSOL) 20 % external solution Apply topically at bedtime. 35 mL 0  . cyclobenzaprine (FLEXERIL) 10 MG tablet Take 1 tablet (10 mg total) by mouth 3 (three) times daily as needed for muscle spasms. 45 tablet 0  . fenofibrate 160 MG tablet TAKE ONE TABLET BY MOUTH ONE TIME DAILY 90 tablet 1  . fexofenadine (ALLEGRA) 180 MG tablet Take 180 mg by mouth. ONE BY MOUTH IN  AM, AND ONE BY MOUTH IN PM     . FLUoxetine (PROZAC) 20 MG tablet TAKE ONE TABLET BY MOUTH ONE TIME DAILY 90 tablet 1  . fluticasone (FLONASE) 50 MCG/ACT nasal spray Place 2 sprays into the nose daily.      Marland Kitchen losartan (COZAAR) 100 MG tablet TAKE 1 TABLET DAILY 90 tablet 1  . meloxicam (MOBIC) 7.5 MG tablet Take 7.5 mg by mouth daily.      . metFORMIN (GLUCOPHAGE) 500 MG tablet Take 1 tablet (500 mg total) by mouth 2 (two) times daily with a meal. 60 tablet 1  . naproxen (NAPROSYN) 500 MG tablet Take 1 tablet (500 mg total) by  mouth 2 (two) times daily with a meal. 60 tablet 0  . omeprazole (PRILOSEC) 20 MG capsule Take 20 mg by mouth daily.      . traMADol (ULTRAM) 50 MG tablet Take 1 tablet (50 mg total) by mouth every 8 (eight) hours as needed. 30 tablet 0  . triamcinolone ointment (KENALOG) 0.1 % Apply 1 application topically 2 (two) times daily. 90 g 1  . valACYclovir (VALTREX) 500 MG tablet Take 1 tablet (500 mg total) by mouth 2 (two) times daily. FOR 3 DAYS 6 tablet 2  . albuterol (PROVENTIL HFA;VENTOLIN HFA) 108 (90 BASE) MCG/ACT inhaler Inhale 2 puffs into the lungs every 4 (four) hours as needed (cough). 1 Inhaler 1   No current facility-administered medications on file prior to visit.    BP 136/84 mmHg  Pulse 101  Temp(Src) 97.9 F (36.6 C) (Oral)  Ht 5\' 8"  (1.727 m)  Wt 275 lb 3.2 oz (124.83 kg)  BMI 41.85 kg/m2  SpO2 96%     Review of Systems  Constitutional: Negative for fever, chills and fatigue.       No fever today but at times reports feeling some  subjective fever.  Respiratory: Negative for cough, choking, chest tightness, shortness of breath and wheezing.   Cardiovascular: Negative for chest pain and palpitations.  Gastrointestinal: Positive for abdominal pain. Negative for nausea, vomiting, diarrhea, constipation, blood in stool, abdominal distention and anal bleeding.       Rt lower quadrant/adenexal region most of the time but occasional ruq pain as well.  Genitourinary: Negative for dysuria, urgency, frequency, hematuria, flank pain, vaginal bleeding, difficulty urinating and vaginal pain.  Musculoskeletal: Positive for back pain.  Neurological: Negative for dizziness and headaches.  Hematological: Negative for adenopathy. Does not bruise/bleed easily.       Objective:   Physical Exam   General Appearance- Not in acute distress.  HEENT Eyes- Scleraeral/Conjuntiva-bilat- Not Yellow. Mouth & Throat- Normal.  Chest and Lung Exam Auscultation: Breath sounds:-Normal. Adventitious sounds:- No Adventitious sounds.  Cardiovascular Auscultation:Rythm - Regular. Heart Sounds -Normal heart sounds.  Abdomen Inspection:-Inspection Normal.  Palpation/Perucssion: Palpation and Percussion of the abdomen reveal- mild faint ruq  Tender, No Rebound tenderness, No rigidity(Guarding) and No Palpable abdominal masses. Moderate rlq pain. Faint heal jar pain.(Note she states this has been case since November) Liver:-Normal.  Spleen:- Normal.   Back- no cva pain          Assessment & Plan:

## 2014-12-22 NOTE — Telephone Encounter (Signed)
Abdominal pain is not new.  Pt saw Dr. Beverely Lowabori 12.10.15 for same symptoms.  She is now experiencing lower back pain and tea colored urine.  Rated pain: 8/10.  With the exception of a HA, she denies any other symptoms.   Pt had an abdominal ultrasound on 11/30/14 that showed the following: IMPRESSION: 1. Liver has a coarse echogenic echotexture consistent with fatty infiltration and/or hepatocellular disease. 2. Contracted gallbladder. No gallstones. Common bile duct not identified. This was a limited exam due to bowel gas and body habitus.  Referrals have been placed of Gynecology and General Surgery.  Given new symptoms, pt was advised to be seen.  Pt has an appointment scheduled with Vanessa RichtersEdward Saguier, PA-C today at 4 pm.

## 2014-12-23 LAB — CBC WITH DIFFERENTIAL/PLATELET
Basophils Absolute: 0 10*3/uL (ref 0.0–0.1)
Basophils Relative: 0 % (ref 0–1)
EOS PCT: 4 % (ref 0–5)
Eosinophils Absolute: 0.3 10*3/uL (ref 0.0–0.7)
HEMATOCRIT: 38.8 % (ref 36.0–46.0)
HEMOGLOBIN: 13.2 g/dL (ref 12.0–15.0)
LYMPHS PCT: 33 % (ref 12–46)
Lymphs Abs: 2.4 10*3/uL (ref 0.7–4.0)
MCH: 29.3 pg (ref 26.0–34.0)
MCHC: 34 g/dL (ref 30.0–36.0)
MCV: 86 fL (ref 78.0–100.0)
MONO ABS: 0.4 10*3/uL (ref 0.1–1.0)
MONOS PCT: 6 % (ref 3–12)
MPV: 9.9 fL (ref 8.6–12.4)
NEUTROS ABS: 4.1 10*3/uL (ref 1.7–7.7)
Neutrophils Relative %: 57 % (ref 43–77)
PLATELETS: 262 10*3/uL (ref 150–400)
RBC: 4.51 MIL/uL (ref 3.87–5.11)
RDW: 13.9 % (ref 11.5–15.5)
WBC: 7.2 10*3/uL (ref 4.0–10.5)

## 2014-12-23 LAB — URINE CULTURE: Colony Count: 85000

## 2014-12-28 ENCOUNTER — Other Ambulatory Visit: Payer: BC Managed Care – PPO

## 2015-01-09 ENCOUNTER — Other Ambulatory Visit: Payer: Self-pay | Admitting: Family Medicine

## 2015-01-09 NOTE — Telephone Encounter (Signed)
Med filled.  

## 2015-01-10 ENCOUNTER — Other Ambulatory Visit: Payer: Self-pay | Admitting: General Practice

## 2015-01-10 MED ORDER — METFORMIN HCL 500 MG PO TABS: 500.0000 mg | ORAL_TABLET | Freq: Two times a day (BID) | ORAL | Status: DC

## 2015-03-21 ENCOUNTER — Other Ambulatory Visit: Payer: Self-pay | Admitting: Family Medicine

## 2015-03-21 NOTE — Telephone Encounter (Signed)
Med filled.  

## 2015-03-22 ENCOUNTER — Other Ambulatory Visit: Payer: Self-pay | Admitting: Family Medicine

## 2015-03-22 NOTE — Telephone Encounter (Signed)
Med filled.  

## 2015-03-28 ENCOUNTER — Ambulatory Visit: Payer: BC Managed Care – PPO | Admitting: Family Medicine

## 2015-05-28 ENCOUNTER — Other Ambulatory Visit: Payer: Self-pay | Admitting: Family Medicine

## 2015-05-28 NOTE — Telephone Encounter (Signed)
Med filled and letter mailed to schedule a follow up appt.

## 2015-06-19 ENCOUNTER — Other Ambulatory Visit: Payer: Self-pay | Admitting: Family Medicine

## 2015-06-19 MED ORDER — METFORMIN HCL 500 MG PO TABS: 500.0000 mg | ORAL_TABLET | Freq: Two times a day (BID) | ORAL | Status: DC

## 2015-06-19 NOTE — Telephone Encounter (Signed)
Cannot fill medication to mail order, pt is due for a diabetes follow up with tabori. Can only fill #30 to a local pharmacy until appt with provider.

## 2015-06-20 ENCOUNTER — Other Ambulatory Visit: Payer: Self-pay | Admitting: Family Medicine

## 2015-06-20 MED ORDER — FENOFIBRATE 160 MG PO TABS: 160.0000 mg | ORAL_TABLET | Freq: Every day | ORAL | Status: DC

## 2015-06-20 MED ORDER — FLUOXETINE HCL 20 MG PO TABS: 20.0000 mg | ORAL_TABLET | Freq: Every day | ORAL | Status: DC

## 2015-06-20 NOTE — Telephone Encounter (Signed)
Med to mail order was denied, pt needs a diabetes follow up appt.

## 2015-07-05 LAB — HM DIABETES EYE EXAM

## 2015-08-03 ENCOUNTER — Encounter: Payer: Self-pay | Admitting: Gastroenterology

## 2015-08-03 ENCOUNTER — Telehealth: Payer: Self-pay | Admitting: General Practice

## 2015-08-03 ENCOUNTER — Encounter: Payer: Self-pay | Admitting: Family Medicine

## 2015-08-03 ENCOUNTER — Ambulatory Visit (INDEPENDENT_AMBULATORY_CARE_PROVIDER_SITE_OTHER): Payer: BLUE CROSS/BLUE SHIELD | Admitting: Family Medicine

## 2015-08-03 VITALS — BP 112/76 | HR 85 | Temp 98.7°F | Resp 18 | Ht 68.0 in | Wt 255.5 lb

## 2015-08-03 DIAGNOSIS — G5602 Carpal tunnel syndrome, left upper limb: Secondary | ICD-10-CM

## 2015-08-03 DIAGNOSIS — I10 Essential (primary) hypertension: Secondary | ICD-10-CM

## 2015-08-03 DIAGNOSIS — E781 Pure hyperglyceridemia: Secondary | ICD-10-CM | POA: Diagnosis not present

## 2015-08-03 DIAGNOSIS — G5601 Carpal tunnel syndrome, right upper limb: Secondary | ICD-10-CM | POA: Diagnosis not present

## 2015-08-03 DIAGNOSIS — E119 Type 2 diabetes mellitus without complications: Secondary | ICD-10-CM

## 2015-08-03 DIAGNOSIS — G5603 Carpal tunnel syndrome, bilateral upper limbs: Secondary | ICD-10-CM

## 2015-08-03 DIAGNOSIS — G5600 Carpal tunnel syndrome, unspecified upper limb: Secondary | ICD-10-CM | POA: Insufficient documentation

## 2015-08-03 DIAGNOSIS — Z1211 Encounter for screening for malignant neoplasm of colon: Secondary | ICD-10-CM

## 2015-08-03 LAB — CBC WITH DIFFERENTIAL/PLATELET
BASOS ABS: 0 10*3/uL (ref 0.0–0.1)
Basophils Relative: 0.6 % (ref 0.0–3.0)
EOS PCT: 3.7 % (ref 0.0–5.0)
Eosinophils Absolute: 0.3 10*3/uL (ref 0.0–0.7)
HCT: 38.5 % (ref 36.0–46.0)
HEMOGLOBIN: 13 g/dL (ref 12.0–15.0)
Lymphocytes Relative: 26.7 % (ref 12.0–46.0)
Lymphs Abs: 1.8 10*3/uL (ref 0.7–4.0)
MCHC: 33.7 g/dL (ref 30.0–36.0)
MCV: 86.1 fl (ref 78.0–100.0)
MONO ABS: 0.4 10*3/uL (ref 0.1–1.0)
MONOS PCT: 5.1 % (ref 3.0–12.0)
Neutro Abs: 4.4 10*3/uL (ref 1.4–7.7)
Neutrophils Relative %: 63.9 % (ref 43.0–77.0)
Platelets: 244 10*3/uL (ref 150.0–400.0)
RBC: 4.48 Mil/uL (ref 3.87–5.11)
RDW: 14.4 % (ref 11.5–15.5)
WBC: 6.9 10*3/uL (ref 4.0–10.5)

## 2015-08-03 LAB — HEPATIC FUNCTION PANEL
ALBUMIN: 4.3 g/dL (ref 3.5–5.2)
ALT: 32 U/L (ref 0–35)
AST: 17 U/L (ref 0–37)
Alkaline Phosphatase: 62 U/L (ref 39–117)
Bilirubin, Direct: 0.2 mg/dL (ref 0.0–0.3)
TOTAL PROTEIN: 7.4 g/dL (ref 6.0–8.3)
Total Bilirubin: 0.6 mg/dL (ref 0.2–1.2)

## 2015-08-03 LAB — BASIC METABOLIC PANEL
BUN: 17 mg/dL (ref 6–23)
CO2: 22 mEq/L (ref 19–32)
CREATININE: 0.98 mg/dL (ref 0.40–1.20)
Calcium: 9.7 mg/dL (ref 8.4–10.5)
Chloride: 106 mEq/L (ref 96–112)
GFR: 62.83 mL/min (ref >60.00)
Glucose, Bld: 139 mg/dL — ABNORMAL HIGH (ref 70–99)
Potassium: 4.2 mEq/L (ref 3.5–5.1)
Sodium: 137 mEq/L (ref 135–145)

## 2015-08-03 LAB — LIPID PANEL
CHOL/HDL RATIO: 7
Cholesterol: 189 mg/dL (ref 0–200)
HDL: 29 mg/dL — AB (ref >39.00)
LDL CALC: 131 mg/dL — AB (ref 0–99)
NonHDL: 160.4
TRIGLYCERIDES: 149 mg/dL (ref 0.0–149.0)
VLDL: 29.8 mg/dL (ref 0.0–40.0)

## 2015-08-03 LAB — HEMOGLOBIN A1C: Hgb A1c MFr Bld: 6.8 % — ABNORMAL HIGH (ref 4.6–6.5)

## 2015-08-03 LAB — TSH: TSH: 1.48 u[IU]/mL (ref 0.35–4.50)

## 2015-08-03 MED ORDER — FLUOXETINE HCL 20 MG PO TABS: 20.0000 mg | ORAL_TABLET | Freq: Every day | ORAL | Status: DC

## 2015-08-03 MED ORDER — FENOFIBRATE 160 MG PO TABS: 160.0000 mg | ORAL_TABLET | Freq: Every day | ORAL | Status: DC

## 2015-08-03 NOTE — Progress Notes (Signed)
   Subjective:    Patient ID: Vanessa Crawford, female    DOB: 01/31/1961, 54 y.o.   MRN: [id number]  HPI HTN- chronic problem, on Losartan.  Excellent control.  Denies CP, SOB, HAs, visual changes, edema.  Hyperlipidemia- chronic problem w/ trigs, on Fenofibrate.  Denies abd pain, N/V, myalgias.  DM- chronic problem, on Metformin.  UTD on eye exam.  Due for foot exam.  On ARB for renal protection.  Pt has lost 20 lbs since last visit!  Exercising- on treadmill, weights.  Has changed diet.  Denies symptomatic lows.  + numbness of hands bilaterally due to carpal tunnel.     Review of Systems For ROS see HPI     Objective:   Physical Exam  Constitutional: She is oriented to person, place, and time. She appears well-developed and well-nourished. No distress.  HENT:  Head: Normocephalic and atraumatic.  Eyes: Conjunctivae and EOM are normal. Pupils are equal, round, and reactive to light.  Neck: Normal range of motion. Neck supple. No thyromegaly present.  Cardiovascular: Normal rate, regular rhythm, normal heart sounds and intact distal pulses.   No murmur heard. Pulmonary/Chest: Effort normal and breath sounds normal. No respiratory distress.  Abdominal: Soft. She exhibits no distension. There is no tenderness.  Musculoskeletal: She exhibits no edema.  Lymphadenopathy:    She has no cervical adenopathy.  Neurological: She is alert and oriented to person, place, and time.  Skin: Skin is warm and dry.  Psychiatric: She has a normal mood and affect. Her behavior is normal.  Vitals reviewed.         Assessment & Plan:

## 2015-08-03 NOTE — Telephone Encounter (Signed)
Called pt and LMOVM to return call. Need to speak with her in regards to CPAP supplies prescription.

## 2015-08-03 NOTE — Assessment & Plan Note (Signed)
Bilateral.  Pt advised to get wrist braces to wear at night.  Due to bilateral sxs and worsening recently, will refer to hand specialist for complete evaluation and tx.  Pt expressed understanding and is in agreement w/ plan.

## 2015-08-03 NOTE — Progress Notes (Signed)
Pre visit review using our clinic review tool, if applicable. No additional management support is needed unless otherwise documented below in the visit note. 

## 2015-08-03 NOTE — Assessment & Plan Note (Signed)
Chronic problem.  Tolerating metformin w/o difficulty.  UTD on eye exam.  Foot exam done today.  On ARB for renal protection.  Check labs.  Adjust meds prn

## 2015-08-03 NOTE — Assessment & Plan Note (Signed)
Chronic problem.  On fenofibrate w/o difficulty.  Applauded lifestyle changes.  Check labs.  Adjust meds prn

## 2015-08-03 NOTE — Patient Instructions (Signed)
Schedule your complete physical in 3-4 months We'll notify you of your lab results and make any changes if needed Keep up the good work on healthy diet and regular exercise We'll call you with your GI appt to discuss colonoscopy and the hand appt for the carpal tunnel Get 2 wrist splints to wear at night Call with any questions or concerns Enjoy your Labor Day!!!

## 2015-08-03 NOTE — Assessment & Plan Note (Signed)
Chronic problem.  Well controlled on current meds.  Asymptomatic.  Check labs.  No anticipated med changes. 

## 2015-08-06 ENCOUNTER — Other Ambulatory Visit: Payer: Self-pay | Admitting: General Practice

## 2015-08-06 DIAGNOSIS — E785 Hyperlipidemia, unspecified: Secondary | ICD-10-CM

## 2015-08-06 DIAGNOSIS — G4733 Obstructive sleep apnea (adult) (pediatric): Secondary | ICD-10-CM

## 2015-08-06 MED ORDER — ATORVASTATIN CALCIUM 10 MG PO TABS: 10.0000 mg | ORAL_TABLET | Freq: Every day | ORAL | Status: DC

## 2015-08-06 NOTE — Telephone Encounter (Signed)
DME completed and faxed to [facility].

## 2015-08-06 NOTE — Telephone Encounter (Signed)
Spoke with pt and she advised that she uses [facility] for supplies. Will try to get this replaced for her.

## 2015-08-29 ENCOUNTER — Other Ambulatory Visit: Payer: Self-pay | Admitting: Family Medicine

## 2015-08-29 NOTE — Telephone Encounter (Signed)
Medication filled to pharmacy as requested.   

## 2015-09-17 ENCOUNTER — Other Ambulatory Visit (INDEPENDENT_AMBULATORY_CARE_PROVIDER_SITE_OTHER): Payer: BLUE CROSS/BLUE SHIELD

## 2015-09-17 DIAGNOSIS — E119 Type 2 diabetes mellitus without complications: Secondary | ICD-10-CM

## 2015-09-17 DIAGNOSIS — E785 Hyperlipidemia, unspecified: Secondary | ICD-10-CM

## 2015-09-17 LAB — HEPATIC FUNCTION PANEL
ALT: 53 U/L — AB (ref 0–35)
AST: 33 U/L (ref 0–37)
Albumin: 3.9 g/dL (ref 3.5–5.2)
Alkaline Phosphatase: 77 U/L (ref 39–117)
BILIRUBIN DIRECT: 0.1 mg/dL (ref 0.0–0.3)
BILIRUBIN TOTAL: 0.4 mg/dL (ref 0.2–1.2)
Total Protein: 7 g/dL (ref 6.0–8.3)

## 2015-09-17 LAB — BASIC METABOLIC PANEL
BUN: 17 mg/dL (ref 6–23)
CHLORIDE: 106 meq/L (ref 96–112)
CO2: 23 mEq/L (ref 19–32)
CREATININE: 1.04 mg/dL (ref 0.40–1.20)
Calcium: 9.4 mg/dL (ref 8.4–10.5)
GFR: 58.64 mL/min — AB (ref >60.00)
Glucose, Bld: 144 mg/dL — ABNORMAL HIGH (ref 70–99)
POTASSIUM: 4.3 meq/L (ref 3.5–5.1)
Sodium: 140 mEq/L (ref 135–145)

## 2015-10-03 ENCOUNTER — Ambulatory Visit: Payer: BLUE CROSS/BLUE SHIELD | Admitting: Gastroenterology

## 2015-11-02 ENCOUNTER — Encounter: Payer: Self-pay | Admitting: Family Medicine

## 2015-11-02 ENCOUNTER — Ambulatory Visit (INDEPENDENT_AMBULATORY_CARE_PROVIDER_SITE_OTHER): Payer: BLUE CROSS/BLUE SHIELD | Admitting: Family Medicine

## 2015-11-02 VITALS — BP 118/76 | HR 98 | Temp 98.1°F | Resp 16 | Ht 68.0 in | Wt 265.2 lb

## 2015-11-02 DIAGNOSIS — Z23 Encounter for immunization: Secondary | ICD-10-CM | POA: Diagnosis not present

## 2015-11-02 DIAGNOSIS — E119 Type 2 diabetes mellitus without complications: Secondary | ICD-10-CM | POA: Diagnosis not present

## 2015-11-02 DIAGNOSIS — Z0000 Encounter for general adult medical examination without abnormal findings: Secondary | ICD-10-CM

## 2015-11-02 DIAGNOSIS — L989 Disorder of the skin and subcutaneous tissue, unspecified: Secondary | ICD-10-CM | POA: Diagnosis not present

## 2015-11-02 MED ORDER — ALBUTEROL SULFATE HFA 108 (90 BASE) MCG/ACT IN AERS: 2.0000 | INHALATION_SPRAY | RESPIRATORY_TRACT | Status: DC | PRN

## 2015-11-02 MED ORDER — FLUOXETINE HCL 20 MG PO TABS: 20.0000 mg | ORAL_TABLET | Freq: Every day | ORAL | Status: DC

## 2015-11-02 MED ORDER — FENOFIBRATE 160 MG PO TABS: 160.0000 mg | ORAL_TABLET | Freq: Every day | ORAL | Status: DC

## 2015-11-02 MED ORDER — FLUTICASONE PROPIONATE 50 MCG/ACT NA SUSP: 2.0000 | Freq: Every day | NASAL | Status: DC

## 2015-11-02 NOTE — Progress Notes (Signed)
   Subjective:    Patient ID: Vanessa Crawford, female    DOB: [birth year], 54 y.o.   MRN: [id number]  HPI CPE- UTD on pap, mammo, foot exam/eye exam.  Due for colonoscopy- has to reschedule b/c she had to cancel.   Review of Systems Patient reports no vision/ hearing changes, adenopathy,fever, weight change,  persistant/recurrent hoarseness , swallowing issues, chest pain, palpitations, edema, persistant/recurrent cough, hemoptysis, dyspnea (rest/exertional/paroxysmal nocturnal), gastrointestinal bleeding (melena, rectal bleeding), abdominal pain, significant heartburn, bowel changes, GU symptoms (dysuria, hematuria, incontinence), Gyn symptoms (abnormal  bleeding, pain),  syncope, focal weakness, memory loss, numbness & tingling, hair/nail changes, abnormal bruising or bleeding, anxiety, or depression.   R forearm skin change    Objective:   Physical Exam General Appearance:    Alert, cooperative, no distress, appears stated age  Head:    Normocephalic, without obvious abnormality, atraumatic  Eyes:    PERRL, conjunctiva/corneas clear, EOM's intact, fundi    benign, both eyes  Ears:    Normal TM's and external ear canals, both ears  Nose:   Nares normal, septum midline, mucosa normal, no drainage    or sinus tenderness  Throat:   Lips, mucosa, and tongue normal; teeth and gums normal  Neck:   Supple, symmetrical, trachea midline, no adenopathy;    Thyroid: no enlargement/tenderness/nodules  Back:     Symmetric, no curvature, ROM normal, no CVA tenderness  Lungs:     Clear to auscultation bilaterally, respirations unlabored  Chest Wall:    No tenderness or deformity   Heart:    Regular rate and rhythm, S1 and S2 normal, no murmur, rub   or gallop  Breast Exam:    Deferred to mammo  Abdomen:     Soft, non-tender, bowel sounds active all four quadrants,    no masses, no organomegaly  Genitalia:    Deferred  Rectal:    Extremities:   Extremities normal, atraumatic, no cyanosis or  edema  Pulses:   2+ and symmetric all extremities  Skin:   Skin color, texture, turgor normal, no rashes.  Pre-cancerous skin lesion on R forearm  Lymph nodes:   Cervical, supraclavicular, and axillary nodes normal  Neurologic:   CNII-XII intact, normal strength, sensation and reflexes    throughout          Assessment & Plan:

## 2015-11-02 NOTE — Progress Notes (Signed)
Pre visit review using our clinic review tool, if applicable. No additional management support is needed unless otherwise documented below in the visit note. 

## 2015-11-02 NOTE — Patient Instructions (Signed)
Follow up in 3-4 months to recheck diabetes We'll notify you of your lab results and make any changes if needed Continue to work on healthy diet and regular exercise- you can do it! We'll call you with your dermatology appt Reschedule your colonoscopy when it is convenient Call with any questions or concerns If you want to join us at the new [place] office, any scheduled appointments will automatically transfer and we will see you at [address], [place], [place] [id number] (OPENING 12/18/15) Happy Holidays!!! Hang in there!!!

## 2015-11-04 NOTE — Assessment & Plan Note (Signed)
Pt's PE WNL w/ exception of skin lesion and being overweight.  UTD on mammo, pap.  Due for colonoscopy- pt plans to reschedule at her convenience.  Check labs.  Anticipatory guidance provided.

## 2015-11-04 NOTE — Assessment & Plan Note (Signed)
New.  Refer to derm for complete evaluation and tx

## 2015-11-04 NOTE — Assessment & Plan Note (Signed)
UTD on eye exam, foot exam.  On ARB for renal protection.  Tolerating metformin w/o difficulty.  Admits to stress eating and has had recent weight gain.  Check labs.  Adjust meds prn

## 2015-11-05 ENCOUNTER — Encounter: Payer: Self-pay | Admitting: Family Medicine

## 2015-11-07 NOTE — Progress Notes (Signed)
Pt was called and scheduled a lab appt for next day off 11/26/15 @7 :30am

## 2015-11-26 ENCOUNTER — Other Ambulatory Visit: Payer: BLUE CROSS/BLUE SHIELD

## 2015-11-26 LAB — HEPATIC FUNCTION PANEL
ALBUMIN: 3.8 g/dL (ref 3.6–5.1)
ALK PHOS: 76 U/L (ref 33–130)
ALT: 31 U/L — ABNORMAL HIGH (ref 6–29)
AST: 16 U/L (ref 10–35)
BILIRUBIN TOTAL: 0.4 mg/dL (ref 0.2–1.2)
Bilirubin, Direct: 0.1 mg/dL (ref <=0.2)
Indirect Bilirubin: 0.3 mg/dL (ref 0.2–1.2)
Total Protein: 6.8 g/dL (ref 6.1–8.1)

## 2015-11-26 LAB — CBC WITH DIFFERENTIAL/PLATELET
BASOS PCT: 0 % (ref 0–1)
Basophils Absolute: 0 10*3/uL (ref 0.0–0.1)
EOS ABS: 0.3 10*3/uL (ref 0.0–0.7)
Eosinophils Relative: 4 % (ref 0–5)
HCT: 38.8 % (ref 36.0–46.0)
Hemoglobin: 12.9 g/dL (ref 12.0–15.0)
Lymphocytes Relative: 29 % (ref 12–46)
Lymphs Abs: 2.1 10*3/uL (ref 0.7–4.0)
MCH: 28.4 pg (ref 26.0–34.0)
MCHC: 33.2 g/dL (ref 30.0–36.0)
MCV: 85.3 fL (ref 78.0–100.0)
MONOS PCT: 6 % (ref 3–12)
MPV: 9.9 fL (ref 8.6–12.4)
Monocytes Absolute: 0.4 10*3/uL (ref 0.1–1.0)
NEUTROS ABS: 4.5 10*3/uL (ref 1.7–7.7)
NEUTROS PCT: 61 % (ref 43–77)
PLATELETS: 263 10*3/uL (ref 150–400)
RBC: 4.55 MIL/uL (ref 3.87–5.11)
RDW: 14 % (ref 11.5–15.5)
WBC: 7.3 10*3/uL (ref 4.0–10.5)

## 2015-11-26 LAB — LIPID PANEL
CHOL/HDL RATIO: 6.9 ratio — AB (ref <=5.0)
CHOLESTEROL: 179 mg/dL (ref 125–200)
HDL: 26 mg/dL — AB (ref >=46)
LDL Cholesterol: 112 mg/dL (ref <130)
Triglycerides: 206 mg/dL — ABNORMAL HIGH (ref <150)
VLDL: 41 mg/dL — AB (ref <30)

## 2015-11-26 LAB — TSH: TSH: 2.133 u[IU]/mL (ref 0.350–4.500)

## 2015-11-26 LAB — BASIC METABOLIC PANEL
BUN: 18 mg/dL (ref 7–25)
CALCIUM: 9 mg/dL (ref 8.6–10.4)
CO2: 19 mmol/L — ABNORMAL LOW (ref 20–31)
Chloride: 103 mmol/L (ref 98–110)
Creat: 0.87 mg/dL (ref 0.50–1.05)
Glucose, Bld: 185 mg/dL — ABNORMAL HIGH (ref 65–99)
POTASSIUM: 4.5 mmol/L (ref 3.5–5.3)
SODIUM: 135 mmol/L (ref 135–146)

## 2015-11-27 LAB — VITAMIN D 25 HYDROXY (VIT D DEFICIENCY, FRACTURES): VIT D 25 HYDROXY: 24 ng/mL — AB (ref 30–100)

## 2015-11-27 LAB — HEMOGLOBIN A1C
Hgb A1c MFr Bld: 7.7 % — ABNORMAL HIGH (ref <5.7)
Mean Plasma Glucose: 174 mg/dL — ABNORMAL HIGH (ref <117)

## 2015-11-28 ENCOUNTER — Other Ambulatory Visit: Payer: Self-pay | Admitting: General Practice

## 2015-11-28 MED ORDER — METFORMIN HCL 1000 MG PO TABS: 1000.0000 mg | ORAL_TABLET | Freq: Two times a day (BID) | ORAL | Status: DC

## 2015-11-28 MED ORDER — VITAMIN D (ERGOCALCIFEROL) 1.25 MG (50000 UNIT) PO CAPS: 50000.0000 [IU] | ORAL_CAPSULE | ORAL | Status: DC

## 2016-01-03 ENCOUNTER — Encounter: Payer: BLUE CROSS/BLUE SHIELD | Admitting: Family Medicine

## 2016-01-21 ENCOUNTER — Ambulatory Visit (INDEPENDENT_AMBULATORY_CARE_PROVIDER_SITE_OTHER): Payer: BLUE CROSS/BLUE SHIELD | Admitting: Medical

## 2016-01-21 ENCOUNTER — Encounter: Payer: Self-pay | Admitting: Medical

## 2016-01-21 VITALS — BP 116/74 | HR 78 | Temp 97.0°F | Resp 16 | Ht 68.0 in | Wt 263.4 lb

## 2016-01-21 DIAGNOSIS — J0100 Acute maxillary sinusitis, unspecified: Secondary | ICD-10-CM

## 2016-01-21 DIAGNOSIS — R05 Cough: Secondary | ICD-10-CM

## 2016-01-21 DIAGNOSIS — J209 Acute bronchitis, unspecified: Secondary | ICD-10-CM

## 2016-01-21 DIAGNOSIS — R062 Wheezing: Secondary | ICD-10-CM | POA: Diagnosis not present

## 2016-01-21 DIAGNOSIS — R059 Cough, unspecified: Secondary | ICD-10-CM

## 2016-01-21 MED ORDER — FLUTICASONE PROPIONATE 50 MCG/ACT NA SUSP: 2.0000 | Freq: Every day | NASAL | Status: DC

## 2016-01-21 MED ORDER — HYDROCODONE-HOMATROPINE 5-1.5 MG/5ML PO SYRP: 5.0000 mL | ORAL_SOLUTION | Freq: Three times a day (TID) | ORAL | Status: DC | PRN

## 2016-01-21 MED ORDER — BECLOMETHASONE DIPROPIONATE 40 MCG/ACT IN AERS: 2.0000 | INHALATION_SPRAY | Freq: Two times a day (BID) | RESPIRATORY_TRACT | Status: AC

## 2016-01-21 MED ORDER — AZITHROMYCIN 250 MG PO TABS: ORAL_TABLET | ORAL | Status: DC

## 2016-01-21 NOTE — Progress Notes (Signed)
Pre visit review using our clinic review tool, if applicable. No additional management support is needed unless otherwise documented below in the visit note. 

## 2016-01-21 NOTE — Patient Instructions (Addendum)
You appear to have a sinus infection(and likely bronchitis as well). I am prescribing  azithromycin antibiotic for the infection. To help with the nasal congestion I prescribed flonase nasal steroid. For your associated cough, I prescribed cough medicine. hycodan  For wheezing use albuterol If you have to use albuterol repeatedly then start qvar  If coughing/lung symptoms worsen despite the above then recommend chest xray  Rest, hydrate, tylenol for fever.  Follow up in 7 days or as needed.

## 2016-01-21 NOTE — Progress Notes (Signed)
Subjective:    Patient ID: Vanessa Crawford, female    DOB: Jul 28, 1961, 55 y.o.   MRN: [id number]  HPI  Pt in with cough nasal and chest congestion. Tickle to throat. Some yellow discharge and blowing nose. When coughs gets up mucous.   Some chills.   Pt came on this past Friday.   Sometimes wheezing when she gets sick. Recently some mild at night.  Pt thinks out of flonase.  Hx of bronchtis and sinusitis fast in the past.     Review of Systems  Constitutional: Positive for diaphoresis. Negative for fever.  HENT: Positive for congestion, postnasal drip and sinus pressure. Negative for ear pain.   Respiratory: Positive for cough and wheezing. Negative for shortness of breath.   Cardiovascular: Negative for chest pain and palpitations.  Gastrointestinal: Negative for abdominal pain.  Musculoskeletal: Negative for back pain.  Psychiatric/Behavioral: Negative for behavioral problems and confusion.    Past Medical History  Diagnosis Date  . Depression   . Hypertension   . Allergy     RHINITIS  . Endometriosis   . Hyperlipidemia     Social History   Social History  . Marital Status: Married    Spouse Name: N/A  . Number of Children: 2  . Years of Education: N/A   Occupational History  . TEAM MANAGER    Social History Main Topics  . Smoking status: Never Smoker   . Smokeless tobacco: Not on file  . Alcohol Use: No  . Drug Use: No  . Sexual Activity: Not on file   Other Topics Concern  . Not on file   Social History Narrative   REG EXERCISE          Past Surgical History  Procedure Laterality Date  . Septoplasty    . Tonsillectomy      Family History  Problem Relation Age of Onset  . Alzheimer's disease Father   . Hypertension Father   . Alzheimer's disease Maternal Grandmother   . Cancer Paternal Grandmother     Allergies  Allergen Reactions  . Aspirin     REACTION: GI upset  . Penicillins     REACTION: unknown rxn to generic pcn  .  Sulfonamide Derivatives     REACTION: unknown reaction    Current Outpatient Prescriptions on File Prior to Visit  Medication Sig Dispense Refill  . albuterol (PROVENTIL HFA;VENTOLIN HFA) 108 (90 BASE) MCG/ACT inhaler Inhale 2 puffs into the lungs every 4 (four) hours as needed (cough). 3 Inhaler 1  . aluminum chloride (DRYSOL) 20 % external solution Apply topically at bedtime. 35 mL 0  . atorvastatin (LIPITOR) 10 MG tablet Take 1 tablet (10 mg total) by mouth daily. 30 tablet 1  . cyclobenzaprine (FLEXERIL) 10 MG tablet Take 1 tablet (10 mg total) by mouth 3 (three) times daily as needed for muscle spasms. 45 tablet 0  . fenofibrate 160 MG tablet Take 1 tablet (160 mg total) by mouth daily. 90 tablet 1  . fexofenadine (ALLEGRA) 180 MG tablet Take 180 mg by mouth. ONE BY MOUTH IN  AM, AND ONE BY MOUTH IN PM     . FLUoxetine (PROZAC) 20 MG tablet Take 1 tablet (20 mg total) by mouth daily. 90 tablet 1  . fluticasone (FLONASE) 50 MCG/ACT nasal spray Place 2 sprays into both nostrils daily. 48 g 1  . losartan (COZAAR) 100 MG tablet Take 1 tablet (100 mg total) by mouth daily. 90 tablet 1  .  meloxicam (MOBIC) 7.5 MG tablet Take 7.5 mg by mouth daily.      . metFORMIN (GLUCOPHAGE) 1000 MG tablet Take 1 tablet (1,000 mg total) by mouth 2 (two) times daily with a meal. 180 tablet 1  . omeprazole (PRILOSEC) 20 MG capsule Take 20 mg by mouth daily.      . traMADol (ULTRAM) 50 MG tablet Take 1 tablet (50 mg total) by mouth every 8 (eight) hours as needed. 30 tablet 0  . triamcinolone ointment (KENALOG) 0.1 % Apply 1 application topically 2 (two) times daily. 90 g 1  . valACYclovir (VALTREX) 500 MG tablet Take 1 tablet (500 mg total) by mouth 2 (two) times daily. FOR 3 DAYS 6 tablet 2  . Vitamin D, Ergocalciferol, (DRISDOL) 50000 UNITS CAPS capsule Take 1 capsule (50,000 Units total) by mouth every 7 (seven) days. 12 capsule 0   No current facility-administered medications on file prior to visit.     BP 116/74 mmHg  Pulse 78  Temp(Src) 97 F (36.1 C) (Oral)  Resp 16  Ht  (1.727 m)  Wt 263 lb 6.4 oz (119.477 kg)  BMI 40.06 kg/m2  SpO2 98%       Objective:   Physical Exam  General  Mental Status - Alert. General Appearance - Well groomed. Not in acute distress.  Skin Rashes- No Rashes. Mild sweating appearance to her cheeks.  HEENT Head- Normal. Ear Auditory Canal - Left- Normal. Right - Normal.Tympanic Membrane- Left- Normal. Right- Normal. Eye Sclera/Conjunctiva- Left- Normal. Right- Normal. Nose & Sinuses Nasal Mucosa- Left-  Boggy and Congested. Right-  Boggy and  Congested.Bilateral maxillary but no  frontal sinus pressure. Mouth & Throat Lips: Upper Lip- Normal: no dryness, cracking, pallor, cyanosis, or vesicular eruption. Lower Lip-Normal: no dryness, cracking, pallor, cyanosis or vesicular eruption. Buccal Mucosa- Bilateral- No Aphthous ulcers. Oropharynx- No Discharge or Erythema. Tonsils: Characteristics- Bilateral- No Erythema or Congestion. Size/Enlargement- Bilateral- No enlargement. Discharge- bilateral-None.  Neck Neck- Supple. No Masses.   Chest and Lung Exam Auscultation: Breath Sounds:-Clear even and unlabored.  Cardiovascular Auscultation:Rythm- Regular, rate and rhythm. Murmurs & Other Heart Sounds:Ausculatation of the heart reveal- No Murmurs.  Lymphatic Head & Neck General Head & Neck Lymphatics: Bilateral: Description- No Localized lymphadenopathy.       Assessment & Plan:  You appear to have a sinus infection(and likely bronchitis as well). I am prescribing  azithromycin antibiotic for the infection. To help with the nasal congestion I prescribed flonase nasal steroid. For your associated cough, I prescribed cough medicine hycodan.  For wheezing use albuterol If you have to use albuterol repeatedly then start qvar  If coughing/lung symptoms worsen despite the above then recommend chest xray  Rest, hydrate, tylenol for  fever.  Follow up in 7 days or as needed.

## 2016-02-25 ENCOUNTER — Other Ambulatory Visit: Payer: Self-pay | Admitting: Family Medicine

## 2016-02-26 NOTE — Telephone Encounter (Signed)
Medication filled to pharmacy as requested.   

## 2016-02-29 ENCOUNTER — Ambulatory Visit: Payer: BLUE CROSS/BLUE SHIELD | Admitting: Family Medicine

## 2016-04-13 ENCOUNTER — Other Ambulatory Visit: Payer: Self-pay | Admitting: Family Medicine

## 2016-05-01 ENCOUNTER — Other Ambulatory Visit: Payer: Self-pay | Admitting: Family Medicine

## 2016-05-01 NOTE — Telephone Encounter (Signed)
Medication filled to pharmacy as requested.   

## 2016-05-06 ENCOUNTER — Other Ambulatory Visit: Payer: Self-pay | Admitting: Family Medicine

## 2016-05-06 MED ORDER — METFORMIN HCL 1000 MG PO TABS: 1000.0000 mg | ORAL_TABLET | Freq: Two times a day (BID) | ORAL | Status: DC

## 2016-07-14 ENCOUNTER — Other Ambulatory Visit: Payer: Self-pay | Admitting: Family Medicine

## 2016-07-21 ENCOUNTER — Other Ambulatory Visit: Payer: Self-pay | Admitting: General Practice

## 2016-07-21 MED ORDER — ALBUTEROL SULFATE HFA 108 (90 BASE) MCG/ACT IN AERS: INHALATION_SPRAY | RESPIRATORY_TRACT | 0 refills | Status: AC

## 2016-07-30 ENCOUNTER — Other Ambulatory Visit: Payer: Self-pay | Admitting: Family Medicine

## 2016-08-12 ENCOUNTER — Telehealth: Payer: Self-pay | Admitting: Family Medicine

## 2016-08-12 MED ORDER — VALACYCLOVIR HCL 500 MG PO TABS: 500.0000 mg | ORAL_TABLET | Freq: Two times a day (BID) | ORAL | 2 refills | Status: DC

## 2016-08-12 NOTE — Telephone Encounter (Signed)
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  [facility] [address]

## 2016-08-12 NOTE — Telephone Encounter (Signed)
Medication filled to pharmacy as requested.   

## 2016-08-24 ENCOUNTER — Other Ambulatory Visit: Payer: Self-pay | Admitting: Family Medicine

## 2016-09-04 ENCOUNTER — Encounter: Payer: Self-pay | Admitting: Family Medicine

## 2016-09-04 ENCOUNTER — Other Ambulatory Visit (INDEPENDENT_AMBULATORY_CARE_PROVIDER_SITE_OTHER): Payer: BLUE CROSS/BLUE SHIELD

## 2016-09-04 ENCOUNTER — Ambulatory Visit (INDEPENDENT_AMBULATORY_CARE_PROVIDER_SITE_OTHER): Payer: BLUE CROSS/BLUE SHIELD | Admitting: Family Medicine

## 2016-09-04 VITALS — BP 124/82 | HR 94 | Temp 98.0°F | Resp 17 | Ht 68.0 in | Wt 268.5 lb

## 2016-09-04 DIAGNOSIS — I10 Essential (primary) hypertension: Secondary | ICD-10-CM

## 2016-09-04 DIAGNOSIS — E781 Pure hyperglyceridemia: Secondary | ICD-10-CM | POA: Diagnosis not present

## 2016-09-04 DIAGNOSIS — E119 Type 2 diabetes mellitus without complications: Secondary | ICD-10-CM | POA: Diagnosis not present

## 2016-09-04 LAB — CBC WITH DIFFERENTIAL/PLATELET
BASOS ABS: 0 10*3/uL (ref 0.0–0.1)
Basophils Relative: 0.6 % (ref 0.0–3.0)
Eosinophils Absolute: 0.3 10*3/uL (ref 0.0–0.7)
Eosinophils Relative: 4.4 % (ref 0.0–5.0)
HCT: 39.8 % (ref 36.0–46.0)
Hemoglobin: 13.4 g/dL (ref 12.0–15.0)
LYMPHS ABS: 2.3 10*3/uL (ref 0.7–4.0)
Lymphocytes Relative: 29.9 % (ref 12.0–46.0)
MCHC: 33.5 g/dL (ref 30.0–36.0)
MCV: 85.2 fl (ref 78.0–100.0)
MONOS PCT: 6.8 % (ref 3.0–12.0)
Monocytes Absolute: 0.5 10*3/uL (ref 0.1–1.0)
NEUTROS PCT: 58.3 % (ref 43.0–77.0)
Neutro Abs: 4.5 10*3/uL (ref 1.4–7.7)
Platelets: 260 10*3/uL (ref 150.0–400.0)
RBC: 4.68 Mil/uL (ref 3.87–5.11)
RDW: 14.3 % (ref 11.5–15.5)
WBC: 7.7 10*3/uL (ref 4.0–10.5)

## 2016-09-04 LAB — LIPID PANEL
CHOLESTEROL: 178 mg/dL (ref 0–200)
HDL: 26.1 mg/dL — AB (ref >39.00)
NonHDL: 151.83
TRIGLYCERIDES: 329 mg/dL — AB (ref 0.0–149.0)
Total CHOL/HDL Ratio: 7
VLDL: 65.8 mg/dL — AB (ref 0.0–40.0)

## 2016-09-04 LAB — HEPATIC FUNCTION PANEL
ALBUMIN: 4.2 g/dL (ref 3.5–5.2)
ALK PHOS: 73 U/L (ref 39–117)
ALT: 44 U/L — ABNORMAL HIGH (ref 0–35)
AST: 19 U/L (ref 0–37)
Bilirubin, Direct: 0 mg/dL (ref 0.0–0.3)
TOTAL PROTEIN: 7.5 g/dL (ref 6.0–8.3)
Total Bilirubin: 0.4 mg/dL (ref 0.2–1.2)

## 2016-09-04 LAB — BASIC METABOLIC PANEL
BUN: 17 mg/dL (ref 6–23)
CALCIUM: 9.3 mg/dL (ref 8.4–10.5)
CO2: 29 mEq/L (ref 19–32)
CREATININE: 1.19 mg/dL (ref 0.40–1.20)
Chloride: 103 mEq/L (ref 96–112)
GFR: 50.02 mL/min — AB (ref >60.00)
GLUCOSE: 192 mg/dL — AB (ref 70–99)
POTASSIUM: 4.4 meq/L (ref 3.5–5.1)
Sodium: 138 mEq/L (ref 135–145)

## 2016-09-04 LAB — LDL CHOLESTEROL, DIRECT: LDL DIRECT: 118 mg/dL

## 2016-09-04 LAB — TSH: TSH: 1.31 u[IU]/mL (ref 0.35–4.50)

## 2016-09-04 LAB — HEMOGLOBIN A1C: HEMOGLOBIN A1C: 7.8 % — AB (ref 4.6–6.5)

## 2016-09-04 NOTE — Assessment & Plan Note (Signed)
Chronic problem.  Tolerating Metformin w/o difficulty.  Overdue for eye exam- pt encouraged to call and schedule.  Foot exam done today.  On ARB for renal protection.  Stressed need to work on [employer] Grouphealthy diet and regular exercise and to stop stress eating.  Check labs.  Adjust meds prn

## 2016-09-04 NOTE — Assessment & Plan Note (Signed)
chronic problem.  Tolerating statin w/o difficulty.  Stressed need for healthy diet and regular exercise.  Check labs.  Adjust meds prn

## 2016-09-04 NOTE — Progress Notes (Signed)
Pre visit review using our clinic review tool, if applicable. No additional management support is needed unless otherwise documented below in the visit note. 

## 2016-09-04 NOTE — Progress Notes (Signed)
   Subjective:    Patient ID: Vanessa Crawford, female    DOB: 1961-06-03, 55 y.o.   MRN: [id number]  HPI DM- chronic problem, on Metformin twice daily.  Due for foot exam, eye exam.  On ARB for renal protection.  Pt admits to stress eating.  Not exercising.  Denies symptomatic lows.  No numbness/tingling of hands/feet.  HTN- chronic problem, on Losartan 100mg  daily w/ adequate control.  Denies CP, SOB, HAs, visual changes, edema.  Hyperlipidemia- chronic problem, on Fenofibrate and Lipitor daily.  Denies abd pain, N/V.  URI- pt reports she is getting over a cold and has not been able to hear out of R ear x4 days.   Review of Systems For ROS see HPI     Objective:   Physical Exam  Constitutional: She is oriented to person, place, and time. She appears well-developed and well-nourished. No distress.  obese  HENT:  Head: Normocephalic and atraumatic.  Right Ear: Tympanic membrane normal.  Left Ear: Tympanic membrane normal.  Nose: Mucosal edema and rhinorrhea present. Right sinus exhibits no maxillary sinus tenderness and no frontal sinus tenderness. Left sinus exhibits no maxillary sinus tenderness and no frontal sinus tenderness.  Mouth/Throat: Mucous membranes are normal. Posterior oropharyngeal erythema (w/ PND) present.  Eyes: Conjunctivae and EOM are normal. Pupils are equal, round, and reactive to light.  Neck: Normal range of motion. Neck supple.  Cardiovascular: Normal rate, regular rhythm, normal heart sounds and intact distal pulses.   Pulmonary/Chest: Effort normal and breath sounds normal. No respiratory distress. She has no wheezes. She has no rales.  Abdominal: Soft. Bowel sounds are normal. She exhibits no distension. There is no tenderness. There is no rebound and no guarding.  Lymphadenopathy:    She has no cervical adenopathy.  Neurological: She is alert and oriented to person, place, and time.  Skin: Skin is warm and dry.  Psychiatric: She has a normal mood and  affect. Her behavior is normal. Thought content normal.  Vitals reviewed.         Assessment & Plan:

## 2016-09-04 NOTE — Patient Instructions (Signed)
Schedule your complete physical in 3-4 months [facility] of [facility] and make any changes if needed Continue to work on healthy diet and regular exercise- you can do it! Call and schedule your eye exam and have them send me a copy of the report Call with any questions or concerns Hang in there!!!  You can do this!!

## 2016-09-04 NOTE — Assessment & Plan Note (Signed)
Chronic problem.  Adequate control today.  Asymptomatic.  Check labs.  No anticipated med changes. 

## 2016-09-05 NOTE — Progress Notes (Signed)
Called pt and lmovm to return call.

## 2016-10-28 ENCOUNTER — Other Ambulatory Visit: Payer: Self-pay | Admitting: Family Medicine

## 2016-11-24 ENCOUNTER — Other Ambulatory Visit: Payer: Self-pay | Admitting: Family Medicine

## 2017-01-13 ENCOUNTER — Ambulatory Visit (INDEPENDENT_AMBULATORY_CARE_PROVIDER_SITE_OTHER): Payer: BLUE CROSS/BLUE SHIELD | Admitting: Family Medicine

## 2017-01-13 ENCOUNTER — Encounter: Payer: Self-pay | Admitting: Family Medicine

## 2017-01-13 VITALS — BP 128/86 | HR 83 | Temp 98.0°F | Resp 16 | Ht 68.0 in | Wt 267.5 lb

## 2017-01-13 DIAGNOSIS — Z0000 Encounter for general adult medical examination without abnormal findings: Secondary | ICD-10-CM | POA: Diagnosis not present

## 2017-01-13 DIAGNOSIS — Z1211 Encounter for screening for malignant neoplasm of colon: Secondary | ICD-10-CM | POA: Diagnosis not present

## 2017-01-13 DIAGNOSIS — Z1231 Encounter for screening mammogram for malignant neoplasm of breast: Secondary | ICD-10-CM

## 2017-01-13 DIAGNOSIS — E119 Type 2 diabetes mellitus without complications: Secondary | ICD-10-CM | POA: Diagnosis not present

## 2017-01-13 LAB — HEMOGLOBIN A1C: HEMOGLOBIN A1C: 8.6 % — AB (ref 4.6–6.5)

## 2017-01-13 LAB — CBC WITH DIFFERENTIAL/PLATELET
BASOS PCT: 1.4 % (ref 0.0–3.0)
Basophils Absolute: 0.1 10*3/uL (ref 0.0–0.1)
EOS PCT: 4.3 % (ref 0.0–5.0)
Eosinophils Absolute: 0.3 10*3/uL (ref 0.0–0.7)
HEMATOCRIT: 40.7 % (ref 36.0–46.0)
Hemoglobin: 13.6 g/dL (ref 12.0–15.0)
LYMPHS ABS: 2.1 10*3/uL (ref 0.7–4.0)
LYMPHS PCT: 30.4 % (ref 12.0–46.0)
MCHC: 33.4 g/dL (ref 30.0–36.0)
MCV: 86.7 fl (ref 78.0–100.0)
MONOS PCT: 6.1 % (ref 3.0–12.0)
Monocytes Absolute: 0.4 10*3/uL (ref 0.1–1.0)
NEUTROS ABS: 4 10*3/uL (ref 1.4–7.7)
NEUTROS PCT: 57.8 % (ref 43.0–77.0)
PLATELETS: 238 10*3/uL (ref 150.0–400.0)
RBC: 4.69 Mil/uL (ref 3.87–5.11)
RDW: 14.5 % (ref 11.5–15.5)
WBC: 6.9 10*3/uL (ref 4.0–10.5)

## 2017-01-13 LAB — LIPID PANEL
Cholesterol: 192 mg/dL (ref 0–200)
HDL: 28.5 mg/dL — AB (ref >39.00)
LDL Cholesterol: 126 mg/dL — ABNORMAL HIGH (ref 0–99)
NonHDL: 163.9
Total CHOL/HDL Ratio: 7
Triglycerides: 189 mg/dL — ABNORMAL HIGH (ref 0.0–149.0)
VLDL: 37.8 mg/dL (ref 0.0–40.0)

## 2017-01-13 LAB — TSH: TSH: 1.6 u[IU]/mL (ref 0.35–4.50)

## 2017-01-13 LAB — BASIC METABOLIC PANEL
BUN: 17 mg/dL (ref 6–23)
CHLORIDE: 104 meq/L (ref 96–112)
CO2: 26 mEq/L (ref 19–32)
Calcium: 9.4 mg/dL (ref 8.4–10.5)
Creatinine, Ser: 0.96 mg/dL (ref 0.40–1.20)
GFR: 64 mL/min (ref >60.00)
Glucose, Bld: 186 mg/dL — ABNORMAL HIGH (ref 70–99)
POTASSIUM: 4.8 meq/L (ref 3.5–5.1)
SODIUM: 136 meq/L (ref 135–145)

## 2017-01-13 LAB — VITAMIN D 25 HYDROXY (VIT D DEFICIENCY, FRACTURES): VITD: 20.73 ng/mL — ABNORMAL LOW (ref 30.00–100.00)

## 2017-01-13 LAB — HEPATIC FUNCTION PANEL
ALK PHOS: 73 U/L (ref 39–117)
ALT: 36 U/L — ABNORMAL HIGH (ref 0–35)
AST: 18 U/L (ref 0–37)
Albumin: 4.2 g/dL (ref 3.5–5.2)
BILIRUBIN DIRECT: 0.1 mg/dL (ref 0.0–0.3)
Total Bilirubin: 0.5 mg/dL (ref 0.2–1.2)
Total Protein: 7 g/dL (ref 6.0–8.3)

## 2017-01-13 MED ORDER — FLUOXETINE HCL 40 MG PO CAPS: 40.0000 mg | ORAL_CAPSULE | Freq: Every day | ORAL | 3 refills | Status: DC

## 2017-01-13 NOTE — Assessment & Plan Note (Signed)
Chronic problem.  Due for eye exam.  Pt encouraged to schedule.  UTD on foot exam.  On ARB for renal protection.  Check labs.  Adjust meds prn

## 2017-01-13 NOTE — Progress Notes (Signed)
Pre visit review using our clinic review tool, if applicable. No additional management support is needed unless otherwise documented below in the visit note. 

## 2017-01-13 NOTE — Progress Notes (Signed)
   Subjective:    Patient ID: Vanessa Crawford, female    DOB: 12-Jul-1961, 56 y.o.   MRN: [id number]  HPI CPE- UTD on foot exam, pap.  Due for mammo, eye exam, colonoscopy.  UTD on Tdap.   Review of Systems Patient reports no vision/ hearing changes, adenopathy,fever, weight change,  persistant/recurrent hoarseness , swallowing issues, chest pain, palpitations, edema, persistant/recurrent cough, hemoptysis, dyspnea (rest/exertional/paroxysmal nocturnal), gastrointestinal bleeding (melena, rectal bleeding), abdominal pain, significant heartburn, bowel changes, GU symptoms (dysuria, hematuria, incontinence), Gyn symptoms (abnormal  bleeding, pain),  syncope, focal weakness, memory loss, numbness & tingling, skin/hair/nail changes, abnormal bruising or bleeding, anxiety, or depression.     Objective:   Physical Exam General Appearance:    Alert, cooperative, no distress, appears stated age, obese  Head:    Normocephalic, without obvious abnormality, atraumatic  Eyes:    PERRL, conjunctiva/corneas clear, EOM's intact, fundi    benign, both eyes  Ears:    Normal TM's and external ear canals, both ears  Nose:   Nares normal, septum midline, mucosa normal, no drainage    or sinus tenderness  Throat:   Lips, mucosa, and tongue normal; teeth and gums normal  Neck:   Supple, symmetrical, trachea midline, no adenopathy;    Thyroid: no enlargement/tenderness/nodules  Back:     Symmetric, no curvature, ROM normal, no CVA tenderness  Lungs:     Clear to auscultation bilaterally, respirations unlabored  Chest Wall:    No tenderness or deformity   Heart:    Regular rate and rhythm, S1 and S2 normal, no murmur, rub   or gallop  Breast Exam:    Deferred to GYN  Abdomen:     Soft, non-tender, bowel sounds active all four quadrants,    no masses, no organomegaly  Genitalia:    Deferred to GYN  Rectal:    Extremities:   Extremities normal, atraumatic, no cyanosis or edema  Pulses:   2+ and symmetric all  extremities  Skin:   Skin color, texture, turgor normal, no rashes or lesions  Lymph nodes:   Cervical, supraclavicular, and axillary nodes normal  Neurologic:   CNII-XII intact, normal strength, sensation and reflexes    throughout          Assessment & Plan:

## 2017-01-13 NOTE — Patient Instructions (Signed)
Follow up in 4-6 weeks to recheck depression/anxiety We'll notify you of your lab results and make any changes if needed Continue to work on healthy diet and regular exercise- you can do it!!! Please call Dr Nile RiggsShapiro and schedule your eye exam We'll call you with your mammogram appt and your colonoscopy consultation I sent the new Prozac prescription to [facility]- you can take 2 of what you currently have and 1 of the new prescription Call with any questions or concerns Hang in there!  You can do it!

## 2017-01-13 NOTE — Assessment & Plan Note (Signed)
Pt's PE WNL w/ exception of obesity.  Due for pap later this year.  Overdue for mammo- order entered.  UTD on Tdap.  Due for colonoscopy- referral placed.  Check labs.  Anticipatory guidance provided.

## 2017-01-14 ENCOUNTER — Other Ambulatory Visit: Payer: Self-pay | Admitting: General Practice

## 2017-01-14 ENCOUNTER — Other Ambulatory Visit: Payer: Self-pay | Admitting: Family Medicine

## 2017-01-14 MED ORDER — LOSARTAN POTASSIUM 100 MG PO TABS: 100.0000 mg | ORAL_TABLET | Freq: Every day | ORAL | 1 refills | Status: DC

## 2017-01-14 MED ORDER — VITAMIN D (ERGOCALCIFEROL) 1.25 MG (50000 UNIT) PO CAPS: 50000.0000 [IU] | ORAL_CAPSULE | ORAL | 0 refills | Status: DC

## 2017-01-14 MED ORDER — METFORMIN HCL 1000 MG PO TABS: 1000.0000 mg | ORAL_TABLET | Freq: Two times a day (BID) | ORAL | 1 refills | Status: DC

## 2017-01-14 MED ORDER — METFORMIN HCL 1000 MG PO TABS: 1000.0000 mg | ORAL_TABLET | Freq: Two times a day (BID) | ORAL | 0 refills | Status: DC

## 2017-02-11 ENCOUNTER — Encounter: Payer: Self-pay | Admitting: Family Medicine

## 2017-02-24 ENCOUNTER — Ambulatory Visit: Payer: BLUE CROSS/BLUE SHIELD | Admitting: Family Medicine

## 2017-02-25 ENCOUNTER — Ambulatory Visit (INDEPENDENT_AMBULATORY_CARE_PROVIDER_SITE_OTHER): Payer: BLUE CROSS/BLUE SHIELD | Admitting: Family Medicine

## 2017-02-25 ENCOUNTER — Ambulatory Visit (INDEPENDENT_AMBULATORY_CARE_PROVIDER_SITE_OTHER)
Admission: RE | Admit: 2017-02-25 | Discharge: 2017-02-25 | Disposition: A | Discharge status: Home / Self Care | Payer: BLUE CROSS/BLUE SHIELD | Source: Ambulatory Visit | Attending: Family Medicine | Admitting: Family Medicine

## 2017-02-25 ENCOUNTER — Encounter: Payer: Self-pay | Admitting: Family Medicine

## 2017-02-25 VITALS — BP 122/80 | HR 86 | Temp 98.1°F | Resp 16 | Ht 68.0 in | Wt 264.5 lb

## 2017-02-25 DIAGNOSIS — F339 Major depressive disorder, recurrent, unspecified: Secondary | ICD-10-CM

## 2017-02-25 DIAGNOSIS — M79672 Pain in left foot: Secondary | ICD-10-CM

## 2017-02-25 NOTE — Progress Notes (Signed)
   Subjective:    Patient ID: Vanessa Crawford, female    DOB: [birth year], 56 y.o.   MRN: [id number]  HPI Depression- ongoing issue for pt.  Prozac was increased from 20 to 40mg  at last visit.  Pt has lost 3 lbs.  Pt reports mood has improved.  Pt has joined Y- going 'a minimum of 3x/week'.  'I'm feeling much better'.  L foot pain- pt reports foot will 'pop when I walk'.  Pain is localized to top of foot.  Intermittent swelling.   Review of Systems For ROS see HPI     Objective:   Physical Exam  Constitutional: She is oriented to person, place, and time. She appears well-developed and well-nourished. No distress.  obese  HENT:  Head: Normocephalic and atraumatic.  Musculoskeletal: She exhibits tenderness (TTP over L tarsometatarsal joint). She exhibits no edema or deformity.  Neurological: She is alert and oriented to person, place, and time.  Skin: Skin is warm and dry.  Psychiatric: She has a normal mood and affect. Her behavior is normal. Thought content normal.  Vitals reviewed.         Assessment & Plan:  Depression- improved w/ increased dose of Prozac.  Pt is feeling better, increased motivation, now exercising.  Will continue to follow.  L foot pain- new.  Pt w/ TTP over L tarsometatarsal joint.  Get xray to assess for stress fx.  Refer to sports med for complete evaluation and treatment- including orthotics if needed

## 2017-02-25 NOTE — Progress Notes (Signed)
Pre visit review using our clinic review tool, if applicable. No additional management support is needed unless otherwise documented below in the visit note. 

## 2017-02-25 NOTE — Patient Instructions (Signed)
Follow up in late April/early May to recheck diabetes Go to [facility] and get your xray done- we'll call you with the results We'll notify you of your Sports Med appt Continue the Prozac daily I'm SO proud of your weight loss efforts!  Keep at it! Call with any questions or concerns Happy Spring!!!

## 2017-03-10 NOTE — Progress Notes (Signed)
Vanessa Crawford [address] [place], [place] [address] Phone: [phone] Subjective:    I'm seeing this patient by the request  of:  Neena Rhymes, MD   CC: Foot pain  [id number]  Vanessa Crawford is a 56 y.o. female coming in with complaint of foot pain  Patient is having foot pain for quite some time.      Patient did have x-rays taken 02/25/2017 of the left foot. This was independently visualized by me. Patient was found to have mild degenerative changes of the midfoot otherwise fairly unremarkable.  Past Medical History:  Diagnosis Date  . Allergy    RHINITIS  . Depression   . Endometriosis   . Hyperlipidemia   . Hypertension    Past Surgical History:  Procedure Laterality Date  . SEPTOPLASTY    . TONSILLECTOMY     Social History   Social History  . Marital status: Married    Spouse name: N/A  . Number of children: 2  . Years of education: N/A   Occupational History  . TEAM MANAGER    Social History Main Topics  . Smoking status: Never Smoker  . Smokeless tobacco: Never Used  . Alcohol use No  . Drug use: No  . Sexual activity: Not Asked   Other Topics Concern  . None   Social History Narrative   REG EXERCISE         Allergies  Allergen Reactions  . Aspirin     REACTION: GI upset  . Penicillins     REACTION: unknown rxn to generic pcn  . Sulfonamide Derivatives     REACTION: unknown reaction   Family History  Problem Relation Age of Onset  . Alzheimer's disease Father   . Hypertension Father   . Alzheimer's disease Maternal Grandmother   . Cancer Paternal Grandmother     Past medical history, social, surgical and family history all reviewed in electronic medical record.  No pertanent information unless stated regarding to the chief complaint.   Review of Systems:Review of systems updated and as accurate as of 03/11/17  No headache, visual changes, nausea, vomiting, diarrhea, constipation, dizziness,  abdominal pain, skin rash, fevers, chills, night sweats, weight loss, swollen lymph nodes, body aches, joint swelling, muscle aches, chest pain, shortness of breath, mood changes.   Objective  Blood pressure 110/80, pulse 96, height 5\' 8"  (1.727 m), weight 259 lb 9.6 oz (117.8 kg), last menstrual period 09/16/2012, SpO2 96 %. Systems examined below as of 03/11/17   General: No apparent distress alert and oriented x3 mood and affect normal, dressed appropriately.  HEENT: Pupils equal, extraocular movements intact  Respiratory: Patient's speak in full sentences and does not appear short of breath  Cardiovascular: No lower extremity edema, non tender, no erythema  Skin: Warm dry intact with no signs of infection or rash on extremities or on axial skeleton.  Abdomen: Soft nontender  Neuro: Cranial nerves II through XII are intact, neurovascularly intact in all extremities with 2+ DTRs and 2+ pulses.  Lymph: No lymphadenopathy of posterior or anterior cervical chain or axillae bilaterally.  Gait normal with good balance and coordination.  MSK:  Non tender with full range of motion and good stability and symmetric strength and tone of shoulders, elbows, wrist, hip, knee and ankles bilaterally.  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 Mild breakdown of the longitudinal and transverse arch. Patient is severely tender to palpation over the dorsal aspect of  the foot. Very small cystic change noted that is freely movable. Trace swelling noted.  Limited muscular skeletal ultrasound was performed and interpreted by Judi SaaZachary M Cybill Uriegas  Limited ultrasound the dorsal foot shows the patient does have multiple stress reactions first possible cortical defects of the proximal metatarsals. Seems to be a burst 3, 4 and potentially even 5. Seems to be extra-articular. Small cystic area above this area on the third metatarsal. Seems to be freely movable. Significant increase in Doppler flow and hypoechoic  changes. Impression: Multiple stress fractures of the left foot   Impression and Recommendations:     This case required medical decision making of moderate complexity.      Note: This dictation was prepared with Dragon dictation along with smaller phrase technology. Any transcriptional errors that result from this process are unintentional.

## 2017-03-11 ENCOUNTER — Ambulatory Visit: Payer: Self-pay

## 2017-03-11 ENCOUNTER — Ambulatory Visit (INDEPENDENT_AMBULATORY_CARE_PROVIDER_SITE_OTHER): Payer: BLUE CROSS/BLUE SHIELD | Admitting: Family Medicine

## 2017-03-11 ENCOUNTER — Encounter: Payer: Self-pay | Admitting: Family Medicine

## 2017-03-11 VITALS — BP 110/80 | HR 96 | Ht 68.0 in | Wt 259.6 lb

## 2017-03-11 DIAGNOSIS — M79672 Pain in left foot: Secondary | ICD-10-CM

## 2017-03-11 DIAGNOSIS — M84375A Stress fracture, left foot, initial encounter for fracture: Secondary | ICD-10-CM | POA: Diagnosis not present

## 2017-03-11 NOTE — Patient Instructions (Addendum)
Good to see you  Vanessa Crawford is your friend. 10 minutes 2 times a day  COntinue wit the vitamin D I think this is key.  Cam walker daily until I see you again.  See me again in 2 weeks. We will check it out and make sure we are doing well.

## 2017-03-11 NOTE — Assessment & Plan Note (Signed)
Patient has multiple areas where patient does have stress fractures of the foot. Patient is known to have severe low vitamin D as well as diabetes and is overweight which increases the likelihood of this as well as why she is likely having slow healing. Patient was trying to increase her walking. Patient is on once weekly vitamin D that I think will be beneficial. Patient put in a Cam Walker today to allow patient not to move the rigid foot at this time. We discussed icing regimen. Do not feel that advance imaging is warranted at this time. We'll monitor for the cyst on the top of the foot. Likely does not have its own vascular supply but difficult to tell today. Once hypoechoic changes are: Hopefully we will be able to further evaluate with ultrasound. Follow-up again in 2-4 weeks.

## 2017-03-29 NOTE — Progress Notes (Signed)
[name] [address] [place], [place] [address] Phone: [phone] Subjective:    I'm seeing this patient by the request  of:  Neena Rhymes, MD   CC: Foot pain  [id number]  Vanessa Crawford is a 56 y.o. female coming in with complaint of foot pain  Patient is having left foot pain for quite some time. Patient was seen previously and did have a stress fracture left foot. Patient states that she has been feeling somewhat better overall. Not as tender. States that she is feeling 50 percent better. Has noticed that the swelling is improved. Mild numbness of the left foot.      Patient did have x-rays taken 02/25/2017 of the left foot. This was independently visualized by me. Patient was found to have mild degenerative changes of the midfoot otherwise fairly unremarkable.  Past Medical History:  Diagnosis Date  . Allergy    RHINITIS  . Depression   . Endometriosis   . Hyperlipidemia   . Hypertension    Past Surgical History:  Procedure Laterality Date  . SEPTOPLASTY    . TONSILLECTOMY     Social History   Social History  . Marital status: Married    Spouse name: N/A  . Number of children: 2  . Years of education: N/A   Occupational History  . TEAM MANAGER    Social History Main Topics  . Smoking status: Never Smoker  . Smokeless tobacco: Never Used  . Alcohol use No  . Drug use: No  . Sexual activity: Not Asked   Other Topics Concern  . None   Social History Narrative   REG EXERCISE         Allergies  Allergen Reactions  . Aspirin     REACTION: GI upset  . Penicillins     REACTION: unknown rxn to generic pcn  . Sulfonamide Derivatives     REACTION: unknown reaction   Family History  Problem Relation Age of Onset  . Alzheimer's disease Father   . Hypertension Father   . Alzheimer's disease Maternal Grandmother   . Cancer Paternal Grandmother     Past medical history, social, surgical and family history all  reviewed in electronic medical record.  No pertanent information unless stated regarding to the chief complaint.   Review of Systems: No headache, visual changes, nausea, vomiting, diarrhea, constipation, dizziness, abdominal pain, skin rash, fevers, chills, night sweats, weight loss, swollen lymph nodes, body aches, joint swelling, muscle aches, chest pain, shortness of breath, mood changes.    Objective  Blood pressure 118/70, pulse 97, resp. rate 16, weight 267 lb (121.1 kg), last menstrual period 09/16/2012, SpO2 99 %. Systems examined below as of 03/30/17   General: No apparent distress alert and oriented x3 mood and affect normal, dressed appropriately.  HEENT: Pupils equal, extraocular movements intact  Respiratory: Patient's speak in full sentences and does not appear short of breath  Cardiovascular: No lower extremity edema, non tender, no erythema  Skin: Warm dry intact with no signs of infection or rash on extremities or on axial skeleton.  Abdomen: Soft nontender  Neuro: Cranial nerves II through XII are intact, neurovascularly intact in all extremities with 2+ DTRs and 2+ pulses.  Lymph: No lymphadenopathy of posterior or anterior cervical chain or axillae bilaterally.  Gait normal with good balance and coordination.  MSK:  Non tender with full range of motion and good stability and symmetric strength and tone of shoulders, elbows, wrist,  hip, knee and ankles bilaterally.  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 Mild breakdown of the longitudinal and transverse arch. Minimal tenderness still remaining over the third metatarsal. Otherwise fairly unremarkable.     Impression and Recommendations:     This case required medical decision making of moderate complexity.      Note: This dictation was prepared with Dragon dictation along with smaller phrase technology. Any transcriptional errors that result from this process are unintentional.

## 2017-03-30 ENCOUNTER — Ambulatory Visit (INDEPENDENT_AMBULATORY_CARE_PROVIDER_SITE_OTHER): Payer: BLUE CROSS/BLUE SHIELD | Admitting: Family Medicine

## 2017-03-30 ENCOUNTER — Encounter: Payer: Self-pay | Admitting: Family Medicine

## 2017-03-30 DIAGNOSIS — M84375D Stress fracture, left foot, subsequent encounter for fracture with routine healing: Secondary | ICD-10-CM | POA: Diagnosis not present

## 2017-03-30 NOTE — Assessment & Plan Note (Signed)
Patient is making progress and this time. Encourage her to continue the once weekly. Cam Walker for one more week and then transition into a rigid shoe. We discussed icing regimen and avoiding being barefoot. Follow-up again in 4 weeks. Exercises given today as well to increase range of motion of the ankle.

## 2017-03-30 NOTE — Progress Notes (Signed)
Pre-visit discussion using our clinic review tool. No additional management support is needed unless otherwise documented below in the visit note.  

## 2017-03-30 NOTE — Patient Instructions (Addendum)
Good to see you.  You are doing well  At work boot still for another week.  AT home can start shoe at night [employer] orthotics "total support" online would be great  Good shoes with rigid bottom.  Dierdre Harness, [place] or New balance greater then 700 No being bare foot Ask insurance about [phone] orthotics See me again in 4 weeks.

## 2017-04-05 ENCOUNTER — Other Ambulatory Visit: Payer: Self-pay | Admitting: Family Medicine

## 2017-04-13 ENCOUNTER — Encounter: Payer: Self-pay | Admitting: Family Medicine

## 2017-04-13 MED ORDER — FENOFIBRATE 160 MG PO TABS: 160.0000 mg | ORAL_TABLET | Freq: Every day | ORAL | 1 refills | Status: AC

## 2017-04-21 LAB — HM PAP SMEAR

## 2017-04-21 LAB — HM MAMMOGRAPHY

## 2017-04-27 NOTE — Progress Notes (Signed)
[name] [address] [place], [place] [address] Phone: [phone] Subjective:    I'm seeing this patient by the request  of:  Sheliah Hatch, MD   CC: Foot pain  [id number]  Vanessa Crawford is a 56 y.o. female coming in with complaint of foot pain  Patient is having left foot pain for quite some time. Patient was seen previously and did have a stress fracture left foot.Patient is making some progress at last exam. Patient was to start transition into a regular shoe. Patient was to continue with the vitamin D supplementation. Patient states  90% better. Patient has walked out of the boot somewhat. Sores on a regular basis when possible. No new symptoms.     Patient did have x-rays taken 02/25/2017 of the left foot. This was independently visualized by me. Patient was found to have mild degenerative changes of the midfoot otherwise fairly unremarkable.  Past Medical History:  Diagnosis Date  . Allergy    RHINITIS  . Depression   . Endometriosis   . Hyperlipidemia   . Hypertension    Past Surgical History:  Procedure Laterality Date  . SEPTOPLASTY    . TONSILLECTOMY     Social History   Social History  . Marital status: Married    Spouse name: N/A  . Number of children: 2  . Years of education: N/A   Occupational History  . TEAM MANAGER    Social History Main Topics  . Smoking status: Never Smoker  . Smokeless tobacco: Never Used  . Alcohol use No  . Drug use: No  . Sexual activity: Not Asked   Other Topics Concern  . None   Social History Narrative   REG EXERCISE         Allergies  Allergen Reactions  . Aspirin     REACTION: GI upset  . Penicillins     REACTION: unknown rxn to generic pcn  . Sulfonamide Derivatives     REACTION: unknown reaction   Family History  Problem Relation Age of Onset  . Alzheimer's disease Father   . Hypertension Father   . Alzheimer's disease Maternal Grandmother   . Cancer  Paternal Grandmother     Past medical history, social, surgical and family history all reviewed in electronic medical record.  No pertanent information unless stated regarding to the chief complaint.   Review of Systems: No headache, visual changes, nausea, vomiting, diarrhea, constipation, dizziness, abdominal pain, skin rash, fevers, chills, night sweats, weight loss, swollen lymph nodes, body aches, joint swelling, muscle aches, chest pain, shortness of breath, mood changes.     Objective  Blood pressure 138/72, pulse 87, height 5\' 8"  (1.727 m), last menstrual period 09/16/2012, SpO2 97 %. Systems examined below as of 04/28/17   Systems examined below as of 04/28/17 General: NAD A&O x3 mood, affect normal  HEENT: Pupils equal, extraocular movements intact no nystagmus Respiratory: not short of breath at rest or with speaking Cardiovascular: No lower extremity edema, non tender Skin: Warm dry intact with no signs of infection or rash on extremities or on axial skeleton. Abdomen: Soft nontender, no masses Neuro: Cranial nerves  intact, neurovascularly intact in all extremities with 2+ DTRs and 2+ pulses. Lymph: No lymphadenopathy appreciated today  Gait Antalgic gait secondary to boot MSK: Non tender with full range of motion and good stability and symmetric strength and tone of shoulders, elbows, wrist,  knee hips and ankles bilaterally.   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 Mild breakdown of the longitudinal and transverse arch. More discomfort over the first metatarsal. No discomfort over the third metatarsal.  Limited musculoskeletal ultrasound was performed and interpreted by Judi SaaZachary M Tyiesha Brackney  Limited ultrasound the patient does have a fall callus formation over the third metatarsal at this time. Seems to have reabsorption already as well. Patient does have a large effusion noted of the first MTP. Impression: Nearly healed stress fracture with reactive  capsulitis  Procedure: Real-time Ultrasound Guided Injection of left first metatarsal Device: GE Logiq Q7 Ultrasound guided injection is preferred based studies that show increased duration, increased effect, greater accuracy, decreased procedural pain, increased response rate, and decreased cost with ultrasound guided versus blind injection.  Verbal informed consent obtained.  Time-out conducted.  Noted no overlying erythema, induration, or other signs of local infection.  Skin prepped in a sterile fashion.  Local anesthesia: Topical Ethyl chloride.  With sterile technique and under real time ultrasound guidance:  With a 25-gauge half-inch needle patient was injected with a total of 0.5 mL of 0.5% Marcaine and 0.5 mL of Kenalog 40 g/dL. Completed without difficulty  Pain immediately resolved suggesting accurate placement of the medication.  Advised to call if fevers/chills, erythema, induration, drainage, or persistent bleeding.  Images permanently stored and available for review in the ultrasound unit.  Impression: Technically successful ultrasound guided injection..     Impression and Recommendations:     This case required medical decision making of moderate complexity.      Note: This dictation was prepared with Dragon dictation along with smaller phrase technology. Any transcriptional errors that result from this process are unintentional.

## 2017-04-28 ENCOUNTER — Encounter: Payer: Self-pay | Admitting: *Deleted

## 2017-04-28 ENCOUNTER — Encounter: Payer: Self-pay | Admitting: Family Medicine

## 2017-04-28 ENCOUNTER — Ambulatory Visit (INDEPENDENT_AMBULATORY_CARE_PROVIDER_SITE_OTHER): Payer: BLUE CROSS/BLUE SHIELD | Admitting: Family Medicine

## 2017-04-28 ENCOUNTER — Ambulatory Visit: Payer: Self-pay

## 2017-04-28 VITALS — BP 138/72 | HR 87 | Ht 68.0 in

## 2017-04-28 DIAGNOSIS — M84375D Stress fracture, left foot, subsequent encounter for fracture with routine healing: Secondary | ICD-10-CM | POA: Diagnosis not present

## 2017-04-28 DIAGNOSIS — M79672 Pain in left foot: Secondary | ICD-10-CM | POA: Diagnosis not present

## 2017-04-28 DIAGNOSIS — M7752 Other enthesopathy of left foot: Secondary | ICD-10-CM | POA: Diagnosis not present

## 2017-04-28 NOTE — Assessment & Plan Note (Signed)
Healed at this time. Patient will transition into the boot. Patient is still with once weekly vitamin D. We discussed icing regimen. Avoiding being barefoot. Follow-up again in 4-6 weeks

## 2017-04-28 NOTE — Assessment & Plan Note (Signed)
Patient given injection today for reactive capsulitis likely secondary to the immobilization. We discussed with patient about icing regimen. Discussed home exercises. No point any type of significant pressure on the first metatarsal. Follow-up again in 4-6 weeks

## 2017-04-28 NOTE — Patient Instructions (Addendum)
Great to see you  We injected the toe joint to get rid of the swelling.  Rigid sole shoe Avoid being barefoot Continue the vitamin D Make an appointment with me for 4 weeks just in case

## 2017-05-01 ENCOUNTER — Encounter: Payer: Self-pay | Admitting: Family Medicine

## 2017-05-01 ENCOUNTER — Ambulatory Visit (INDEPENDENT_AMBULATORY_CARE_PROVIDER_SITE_OTHER): Payer: BLUE CROSS/BLUE SHIELD | Admitting: Family Medicine

## 2017-05-01 VITALS — BP 114/82 | HR 76 | Temp 98.0°F | Resp 16 | Ht 68.0 in | Wt 265.0 lb

## 2017-05-01 DIAGNOSIS — Z1159 Encounter for screening for other viral diseases: Secondary | ICD-10-CM | POA: Diagnosis not present

## 2017-05-01 DIAGNOSIS — E119 Type 2 diabetes mellitus without complications: Secondary | ICD-10-CM

## 2017-05-01 LAB — BASIC METABOLIC PANEL
BUN: 18 mg/dL (ref 6–23)
CALCIUM: 9.7 mg/dL (ref 8.4–10.5)
CO2: 23 meq/L (ref 19–32)
Chloride: 104 mEq/L (ref 96–112)
Creatinine, Ser: 0.93 mg/dL (ref 0.40–1.20)
GFR: 66.32 mL/min (ref >60.00)
GLUCOSE: 194 mg/dL — AB (ref 70–99)
POTASSIUM: 4.7 meq/L (ref 3.5–5.1)
SODIUM: 136 meq/L (ref 135–145)

## 2017-05-01 LAB — HEMOGLOBIN A1C: HEMOGLOBIN A1C: 7.7 % — AB (ref 4.6–6.5)

## 2017-05-01 NOTE — Progress Notes (Signed)
Pre visit review using our clinic review tool, if applicable. No additional management support is needed unless otherwise documented below in the visit note. 

## 2017-05-01 NOTE — Progress Notes (Signed)
   Subjective:    Patient ID: Vanessa Crawford, female    DOB: 14-Feb-1961, 56 y.o.   MRN: [id number]  HPI DM- chronic problem, on Metformin twice daily.  On ARB for renal protection.  UTD on foot exam.  Due for eye exam- pt plans to schedule for this summer.  Pt reports 'i feel pretty good'.  Denies CP, SOB, HAs, visual changes, edema.  No abd pain, N/V.  No symptomatic lows.  No weakness/numbness of hands/feet.  Has been exercising but hit a snag w/ stress fx of L foot.  Plans to resume her increased activity level shortly.   Review of Systems For ROS see HPI     Objective:   Physical Exam  Constitutional: She is oriented to person, place, and time. She appears well-developed and well-nourished. No distress.  obese  HENT:  Head: Normocephalic and atraumatic.  Eyes: Conjunctivae and EOM are normal. Pupils are equal, round, and reactive to light.  Neck: Normal range of motion. Neck supple. No thyromegaly present.  Cardiovascular: Normal rate, regular rhythm, normal heart sounds and intact distal pulses.   No murmur heard. Pulmonary/Chest: Effort normal and breath sounds normal. No respiratory distress.  Abdominal: Soft. She exhibits no distension. There is no tenderness.  Musculoskeletal: She exhibits no edema.  Lymphadenopathy:    She has no cervical adenopathy.  Neurological: She is alert and oriented to person, place, and time.  Skin: Skin is warm and dry.  Psychiatric: She has a normal mood and affect. Her behavior is normal.  Vitals reviewed.         Assessment & Plan:

## 2017-05-01 NOTE — Patient Instructions (Signed)
Follow up in 3-4 months to recheck BP, cholesterol, and diabetes We'll notify you of your lab results and make any changes if needed Continue to work on healthy diet and regular exercise- you're doing great!!! Call and schedule your eye exam Call with any questions or concerns Have a great summer!!!

## 2017-05-01 NOTE — Assessment & Plan Note (Signed)
Chronic problem.  Some hx of poor control but pt is working on changes to diet and increased exercise.  Due for eye exam- pt plans to schedule.  UTD on foot exam.  On ARB for renal protection.  Check labs.  Adjust meds prn

## 2017-05-02 LAB — HEPATITIS C ANTIBODY: HCV AB: NEGATIVE

## 2017-05-22 ENCOUNTER — Other Ambulatory Visit: Payer: Self-pay | Admitting: General Practice

## 2017-05-22 MED ORDER — FLUOXETINE HCL 40 MG PO CAPS: 40.0000 mg | ORAL_CAPSULE | Freq: Every day | ORAL | 3 refills | Status: DC

## 2017-05-26 ENCOUNTER — Encounter: Payer: Self-pay | Admitting: Family Medicine

## 2017-05-26 ENCOUNTER — Ambulatory Visit: Payer: Self-pay

## 2017-05-26 ENCOUNTER — Ambulatory Visit (INDEPENDENT_AMBULATORY_CARE_PROVIDER_SITE_OTHER): Payer: BLUE CROSS/BLUE SHIELD | Admitting: Family Medicine

## 2017-05-26 VITALS — BP 120/74 | HR 89 | Ht 68.0 in | Wt 266.0 lb

## 2017-05-26 DIAGNOSIS — M7752 Other enthesopathy of left foot: Secondary | ICD-10-CM | POA: Diagnosis not present

## 2017-05-26 DIAGNOSIS — M84375D Stress fracture, left foot, subsequent encounter for fracture with routine healing: Secondary | ICD-10-CM

## 2017-05-26 DIAGNOSIS — M19079 Primary osteoarthritis, unspecified ankle and foot: Secondary | ICD-10-CM

## 2017-05-26 NOTE — Assessment & Plan Note (Signed)
Resolved

## 2017-05-26 NOTE — Assessment & Plan Note (Signed)
Will monitor

## 2017-05-26 NOTE — Assessment & Plan Note (Signed)
Healed at this time. 

## 2017-05-26 NOTE — Progress Notes (Signed)
[facility] [address] [place], [place] [address] Phone: [phone] Subjective:    I'm seeing this patient by the request  of:  Vanessa Hatch, MD   CC: Foot pain f/u   BJY:NWGNFAOZHY  Vanessa Crawford is a 56 y.o. female coming in with complaint of foot pain  Patient is having left foot pain for quite some time. Patient was seen previously and did have a stress fracture left foot.Patient is making some progress at last exam. Patient was to start transition into a regular shoe.Had MRI reactive first metatarsal synovitis. Was given an injection. 99% better. Very minimal twinge from time to time. Nothing severe.     Patient did have x-rays taken 02/25/2017 of the left foot. This was independently visualized by me. Patient was found to have mild degenerative changes of the midfoot otherwise fairly unremarkable.  Past Medical History:  Diagnosis Date  . Allergy    RHINITIS  . Depression   . Endometriosis   . Hyperlipidemia   . Hypertension    Past Surgical History:  Procedure Laterality Date  . SEPTOPLASTY    . TONSILLECTOMY     Social History   Social History  . Marital status: Married    Spouse name: N/A  . Number of children: 2  . Years of education: N/A   Occupational History  . TEAM MANAGER    Social History Main Topics  . Smoking status: Never Smoker  . Smokeless tobacco: Never Used  . Alcohol use No  . Drug use: No  . Sexual activity: Not Asked   Other Topics Concern  . None   Social History Narrative   REG EXERCISE         Allergies  Allergen Reactions  . Aspirin     REACTION: GI upset  . Penicillins     REACTION: unknown rxn to generic pcn  . Sulfonamide Derivatives     REACTION: unknown reaction   Family History  Problem Relation Age of Onset  . Alzheimer's disease Father   . Hypertension Father   . Alzheimer's disease Maternal Grandmother   . Cancer Paternal Grandmother     Past medical history,  social, surgical and family history all reviewed in electronic medical record.  No pertanent information unless stated regarding to the chief complaint.   Review of Systems: No headache, visual changes, nausea, vomiting, diarrhea, constipation, dizziness, abdominal pain, skin rash, fevers, chills, night sweats, weight loss, swollen lymph nodes, body aches, joint swelling, muscle aches, chest pain, shortness of breath, mood changes.    Objective  Blood pressure 120/74, pulse 89, height 5\' 8"  (1.727 m), weight 266 lb (120.7 kg), last menstrual period 09/16/2012, SpO2 97 %.   Systems examined below as of 05/26/17 General: NAD A&O x3 mood, affect normal  HEENT: Pupils equal, extraocular movements intact no nystagmus Respiratory: not short of breath at rest or with speaking Cardiovascular: No lower extremity edema, non tender Skin: Warm dry intact with no signs of infection or rash on extremities or on axial skeleton. Abdomen: Soft nontender, no masses Neuro: Cranial nerves  intact, neurovascularly intact in all extremities with 2+ DTRs and 2+ pulses. Lymph: No lymphadenopathy appreciated today  Gait normal with good balance and coordination.  MSK: Non tender with full range of motion and good stability and symmetric strength and tone of shoulders, elbows, wrist,  knee hips and ankles bilaterally.   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 Mild breakdown  of the longitudinal and transverse arch. Nontender over the first metatarsal.  Limited musculoskeletal ultrasound was performed and interpreted by Vanessa Crawford  Limited ultrasound shows that the effusion that was noted worsening that is associated first metatarsal has been resolved. Still some midfoot arthritis with some mild reactive Doppler flow      Impression and Recommendations:     This case required medical decision making of moderate complexity.      Note: This dictation was prepared with Dragon dictation along  with smaller phrase technology. Any transcriptional errors that result from this process are unintentional.

## 2017-06-02 ENCOUNTER — Other Ambulatory Visit: Payer: Self-pay | Admitting: General Practice

## 2017-06-02 MED ORDER — FLUOXETINE HCL 40 MG PO CAPS: 40.0000 mg | ORAL_CAPSULE | Freq: Every day | ORAL | 1 refills | Status: DC

## 2017-07-14 ENCOUNTER — Other Ambulatory Visit: Payer: Self-pay | Admitting: Family Medicine

## 2017-09-03 ENCOUNTER — Encounter: Payer: Self-pay | Admitting: Family Medicine

## 2017-09-03 ENCOUNTER — Other Ambulatory Visit: Payer: Self-pay | Admitting: General Practice

## 2017-09-03 ENCOUNTER — Ambulatory Visit (INDEPENDENT_AMBULATORY_CARE_PROVIDER_SITE_OTHER): Payer: BLUE CROSS/BLUE SHIELD | Admitting: Family Medicine

## 2017-09-03 VITALS — BP 122/78 | HR 101 | Temp 99.0°F | Resp 16 | Ht 68.0 in | Wt 253.2 lb

## 2017-09-03 DIAGNOSIS — E119 Type 2 diabetes mellitus without complications: Secondary | ICD-10-CM | POA: Diagnosis not present

## 2017-09-03 DIAGNOSIS — I10 Essential (primary) hypertension: Secondary | ICD-10-CM

## 2017-09-03 DIAGNOSIS — E781 Pure hyperglyceridemia: Secondary | ICD-10-CM | POA: Diagnosis not present

## 2017-09-03 LAB — CBC WITH DIFFERENTIAL/PLATELET
BASOS ABS: 0 10*3/uL (ref 0.0–0.1)
Basophils Relative: 0.2 % (ref 0.0–3.0)
Eosinophils Absolute: 0.1 10*3/uL (ref 0.0–0.7)
Eosinophils Relative: 1 % (ref 0.0–5.0)
HCT: 40.5 % (ref 36.0–46.0)
HEMOGLOBIN: 13.4 g/dL (ref 12.0–15.0)
LYMPHS ABS: 1 10*3/uL (ref 0.7–4.0)
LYMPHS PCT: 13.2 % (ref 12.0–46.0)
MCHC: 33.2 g/dL (ref 30.0–36.0)
MCV: 87.5 fl (ref 78.0–100.0)
MONOS PCT: 5.9 % (ref 3.0–12.0)
Monocytes Absolute: 0.4 10*3/uL (ref 0.1–1.0)
NEUTROS PCT: 79.7 % — AB (ref 43.0–77.0)
Neutro Abs: 5.8 10*3/uL (ref 1.4–7.7)
Platelets: 291 10*3/uL (ref 150.0–400.0)
RBC: 4.62 Mil/uL (ref 3.87–5.11)
RDW: 14.3 % (ref 11.5–15.5)
WBC: 7.3 10*3/uL (ref 4.0–10.5)

## 2017-09-03 LAB — LDL CHOLESTEROL, DIRECT: Direct LDL: 119 mg/dL

## 2017-09-03 LAB — LIPID PANEL
CHOL/HDL RATIO: 5
Cholesterol: 193 mg/dL (ref 0–200)
HDL: 36 mg/dL — ABNORMAL LOW (ref >39.00)
NONHDL: 157.49
Triglycerides: 238 mg/dL — ABNORMAL HIGH (ref 0.0–149.0)
VLDL: 47.6 mg/dL — AB (ref 0.0–40.0)

## 2017-09-03 LAB — BASIC METABOLIC PANEL
BUN: 20 mg/dL (ref 6–23)
CO2: 23 mEq/L (ref 19–32)
Calcium: 9.4 mg/dL (ref 8.4–10.5)
Chloride: 104 mEq/L (ref 96–112)
Creatinine, Ser: 0.84 mg/dL (ref 0.40–1.20)
GFR: 74.49 mL/min (ref >60.00)
Glucose, Bld: 157 mg/dL — ABNORMAL HIGH (ref 70–99)
POTASSIUM: 4.5 meq/L (ref 3.5–5.1)
SODIUM: 136 meq/L (ref 135–145)

## 2017-09-03 LAB — HEPATIC FUNCTION PANEL
ALT: 23 U/L (ref 0–35)
AST: 11 U/L (ref 0–37)
Albumin: 4.1 g/dL (ref 3.5–5.2)
Alkaline Phosphatase: 75 U/L (ref 39–117)
BILIRUBIN DIRECT: 0.1 mg/dL (ref 0.0–0.3)
BILIRUBIN TOTAL: 0.5 mg/dL (ref 0.2–1.2)
TOTAL PROTEIN: 7.1 g/dL (ref 6.0–8.3)

## 2017-09-03 LAB — TSH: TSH: 1.8 u[IU]/mL (ref 0.35–4.50)

## 2017-09-03 LAB — HEMOGLOBIN A1C: HEMOGLOBIN A1C: 6.8 % — AB (ref 4.6–6.5)

## 2017-09-03 MED ORDER — ATORVASTATIN CALCIUM 10 MG PO TABS: 10.0000 mg | ORAL_TABLET | Freq: Every day | ORAL | 1 refills | Status: DC

## 2017-09-03 NOTE — Progress Notes (Signed)
   Subjective:    Patient ID: Vanessa Crawford, female    DOB: [birth year], 56 y.o.   MRN: [id number]  HPI DM- chronic problem, on Metformin  BID.  Pt is down 13 lbs from last visit.  Exercising regularly and watching her diet.  Due for foot exam today.  On ARB for renal protection.  Due for eye exam.  Denies symptomatic lows.  No numbness/tingling of hands/feet.  HTN- chronic problem, on Losartan daily w/ good control.  Denies CP, SOB, HAs, visual changes, edema.  Hyperlipidemia- chronic problem, on Lipitor  daily and fenofibrate  daily.  Denies abd pain, N/V, myalgias.   Review of Systems For ROS see HPI     Objective:   Physical Exam  Constitutional: She is oriented to person, place, and time. She appears well-developed and well-nourished. No distress.  HENT:  Head: Normocephalic and atraumatic.  Eyes: Pupils are equal, round, and reactive to light. Conjunctivae and EOM are normal.  Neck: Normal range of motion. Neck supple. No thyromegaly present.  Cardiovascular: Normal rate, regular rhythm, normal heart sounds and intact distal pulses.   No murmur heard. Pulmonary/Chest: Effort normal and breath sounds normal. No respiratory distress.  Abdominal: Soft. She exhibits no distension. There is no tenderness.  Musculoskeletal: She exhibits no edema.  Lymphadenopathy:    She has no cervical adenopathy.  Neurological: She is alert and oriented to person, place, and time.  Skin: Skin is warm and dry.  Psychiatric: She has a normal mood and affect. Her behavior is normal.  Vitals reviewed.         Assessment & Plan:

## 2017-09-03 NOTE — Progress Notes (Signed)
Pre visit review using our clinic review tool, if applicable. No additional management support is needed unless otherwise documented below in the visit note. 

## 2017-09-03 NOTE — Patient Instructions (Signed)
Schedule your complete physical for after 1/30 We'll notify you of your lab results and make any changes if needed Continue to work on healthy diet and regular exercise- I'm SO proud of you! Schedule your eye exam and have them send me the report Complete the Cologuard and return as directed Call with any questions or concerns Happy Fall!!!

## 2017-09-03 NOTE — Assessment & Plan Note (Signed)
Chronic problem.  Well controlled today.  Asymptomatic.  Check labs.  No anticipated med changes.  Will follow. 

## 2017-09-03 NOTE — Addendum Note (Signed)
Addended by: Sheliah Hatch on: 09/03/2017 09:19 AM   Modules accepted: Orders

## 2017-09-03 NOTE — Assessment & Plan Note (Signed)
Chronic problem.  Tolerating metformin w/o difficulty.  Foot exam done today.  On ARB for renal protection.  Due for eye exam- pt plans to schedule.  Check labs.  Adjust meds prn

## 2017-09-03 NOTE — Assessment & Plan Note (Signed)
Chronic problem.  Tolerating statin w/o difficulty.  Applauded her weight loss efforts.  Check labs.  Adjust meds prn  

## 2017-10-23 DIAGNOSIS — G4733 Obstructive sleep apnea (adult) (pediatric): Secondary | ICD-10-CM | POA: Diagnosis not present

## 2018-01-19 ENCOUNTER — Encounter: Payer: BLUE CROSS/BLUE SHIELD | Admitting: Family Medicine

## 2018-03-17 ENCOUNTER — Other Ambulatory Visit: Payer: Self-pay | Admitting: Family Medicine

## 2018-03-26 ENCOUNTER — Other Ambulatory Visit: Payer: Self-pay

## 2018-03-26 ENCOUNTER — Ambulatory Visit (INDEPENDENT_AMBULATORY_CARE_PROVIDER_SITE_OTHER): Payer: BLUE CROSS/BLUE SHIELD | Admitting: Family Medicine

## 2018-03-26 ENCOUNTER — Encounter: Payer: Self-pay | Admitting: Family Medicine

## 2018-03-26 VITALS — BP 124/78 | HR 81 | Temp 98.1°F | Resp 16 | Ht 68.0 in | Wt 276.0 lb

## 2018-03-26 DIAGNOSIS — F339 Major depressive disorder, recurrent, unspecified: Secondary | ICD-10-CM

## 2018-03-26 DIAGNOSIS — E119 Type 2 diabetes mellitus without complications: Secondary | ICD-10-CM | POA: Diagnosis not present

## 2018-03-26 DIAGNOSIS — Z0000 Encounter for general adult medical examination without abnormal findings: Secondary | ICD-10-CM

## 2018-03-26 DIAGNOSIS — E559 Vitamin D deficiency, unspecified: Secondary | ICD-10-CM | POA: Diagnosis not present

## 2018-03-26 LAB — CBC WITH DIFFERENTIAL/PLATELET
BASOS PCT: 0.7 % (ref 0.0–3.0)
Basophils Absolute: 0.1 10*3/uL (ref 0.0–0.1)
EOS PCT: 4.5 % (ref 0.0–5.0)
Eosinophils Absolute: 0.3 10*3/uL (ref 0.0–0.7)
HEMATOCRIT: 40.6 % (ref 36.0–46.0)
HEMOGLOBIN: 13.6 g/dL (ref 12.0–15.0)
Lymphocytes Relative: 31.8 % (ref 12.0–46.0)
Lymphs Abs: 2.3 10*3/uL (ref 0.7–4.0)
MCHC: 33.5 g/dL (ref 30.0–36.0)
MCV: 85.1 fl (ref 78.0–100.0)
MONOS PCT: 6.2 % (ref 3.0–12.0)
Monocytes Absolute: 0.4 10*3/uL (ref 0.1–1.0)
Neutro Abs: 4.1 10*3/uL (ref 1.4–7.7)
Neutrophils Relative %: 56.8 % (ref 43.0–77.0)
Platelets: 246 10*3/uL (ref 150.0–400.0)
RBC: 4.77 Mil/uL (ref 3.87–5.11)
RDW: 14.8 % (ref 11.5–15.5)
WBC: 7.3 10*3/uL (ref 4.0–10.5)

## 2018-03-26 LAB — BASIC METABOLIC PANEL
BUN: 12 mg/dL (ref 6–23)
CHLORIDE: 101 meq/L (ref 96–112)
CO2: 25 mEq/L (ref 19–32)
CREATININE: 0.9 mg/dL (ref 0.40–1.20)
Calcium: 9.3 mg/dL (ref 8.4–10.5)
GFR: 68.65 mL/min (ref >60.00)
Glucose, Bld: 190 mg/dL — ABNORMAL HIGH (ref 70–99)
POTASSIUM: 4.4 meq/L (ref 3.5–5.1)
Sodium: 135 mEq/L (ref 135–145)

## 2018-03-26 LAB — HEPATIC FUNCTION PANEL
ALT: 35 U/L (ref 0–35)
AST: 16 U/L (ref 0–37)
Albumin: 4.1 g/dL (ref 3.5–5.2)
Alkaline Phosphatase: 86 U/L (ref 39–117)
BILIRUBIN TOTAL: 0.6 mg/dL (ref 0.2–1.2)
Bilirubin, Direct: 0.1 mg/dL (ref 0.0–0.3)
Total Protein: 7 g/dL (ref 6.0–8.3)

## 2018-03-26 LAB — LIPID PANEL
CHOL/HDL RATIO: 5
Cholesterol: 144 mg/dL (ref 0–200)
HDL: 26.5 mg/dL — ABNORMAL LOW (ref >39.00)
NONHDL: 117.86
TRIGLYCERIDES: 234 mg/dL — AB (ref 0.0–149.0)
VLDL: 46.8 mg/dL — AB (ref 0.0–40.0)

## 2018-03-26 LAB — HEMOGLOBIN A1C: Hgb A1c MFr Bld: 9.6 % — ABNORMAL HIGH (ref 4.6–6.5)

## 2018-03-26 LAB — TSH: TSH: 1.65 u[IU]/mL (ref 0.35–4.50)

## 2018-03-26 LAB — LDL CHOLESTEROL, DIRECT: LDL DIRECT: 89 mg/dL

## 2018-03-26 LAB — VITAMIN D 25 HYDROXY (VIT D DEFICIENCY, FRACTURES): VITD: 24.74 ng/mL — ABNORMAL LOW (ref 30.00–100.00)

## 2018-03-26 MED ORDER — FEXOFENADINE HCL 180 MG PO TABS: 180.0000 mg | ORAL_TABLET | Freq: Every day | ORAL | 3 refills | Status: DC

## 2018-03-26 MED ORDER — OMEPRAZOLE 20 MG PO CPDR: 20.0000 mg | DELAYED_RELEASE_CAPSULE | Freq: Every day | ORAL | 1 refills | Status: DC

## 2018-03-26 NOTE — Assessment & Plan Note (Signed)
Adequate control today on Prozac 40mg  daily

## 2018-03-26 NOTE — Assessment & Plan Note (Signed)
Pt has hx of this.  Check labs.  Replete prn. 

## 2018-03-26 NOTE — Patient Instructions (Addendum)
Follow up in 3-4 months to recheck diabetes We'll notify you of your lab results and make any changes if needed Continue to work on diet and exercise- you can do it! Schedule your mammogram for next month! Have Dr Hazle Quantigby send me a copy of your eye exam Let me know when you are willing/ready to have the colonoscopy consultation Call with any questions or concerns Hang in there!

## 2018-03-26 NOTE — Progress Notes (Signed)
   Subjective:    Patient ID: Vanessa Crawford, female    DOB: 08/09/1961, 57 y.o.   MRN: [id number]  HPI CPE- overdue for colonoscopy (pt did not call back when GI called).  UTD on mammo (due next month).  UTD on pap, UTD on Tdap.  Pt has gained 23 lbs since September.  Has eye exam scheduled.  Pt has not been able to tolerate Metformin due to GI issues (diarrhea)   Review of Systems Patient reports no vision/ hearing changes, adenopathy,fever, weight change,  persistant/recurrent hoarseness , swallowing issues, chest pain, palpitations, edema, persistant/recurrent cough, hemoptysis, dyspnea (rest/exertional/paroxysmal nocturnal), gastrointestinal bleeding (melena, rectal bleeding), abdominal pain, significant heartburn, bowel changes, GU symptoms (dysuria, hematuria, incontinence), Gyn symptoms (abnormal  bleeding, pain),  syncope, focal weakness, memory loss, numbness & tingling, skin/hair/nail changes, abnormal bruising or bleeding, anxiety, or depression.     Objective:   Physical Exam General Appearance:    Alert, cooperative, no distress, appears stated age, obese  Head:    Normocephalic, without obvious abnormality, atraumatic  Eyes:    PERRL, conjunctiva/corneas clear, EOM's intact, fundi    benign, both eyes  Ears:    Normal TM's and external ear canals, both ears  Nose:   Nares normal, septum midline, mucosa normal, no drainage    or sinus tenderness  Throat:   Lips, mucosa, and tongue normal; teeth and gums normal  Neck:   Supple, symmetrical, trachea midline, no adenopathy;    Thyroid: no enlargement/tenderness/nodules  Back:     Symmetric, no curvature, ROM normal, no CVA tenderness  Lungs:     Clear to auscultation bilaterally, respirations unlabored  Chest Wall:    No tenderness or deformity   Heart:    Regular rate and rhythm, S1 and S2 normal, no murmur, rub   or gallop  Breast Exam:    Deferred to GYN  Abdomen:     Soft, non-tender, bowel sounds active all four  quadrants,    no masses, no organomegaly  Genitalia:    Deferred to GYN  Rectal:    Extremities:   Extremities normal, atraumatic, no cyanosis or edema  Pulses:   2+ and symmetric all extremities  Skin:   Skin color, texture, turgor normal, no rashes or lesions  Lymph nodes:   Cervical, supraclavicular, and axillary nodes normal  Neurologic:   CNII-XII intact, normal strength, sensation and reflexes    throughout          Assessment & Plan:

## 2018-03-26 NOTE — Assessment & Plan Note (Signed)
Pt's PE WNL w/ exception of obesity.  UTD on mammo- plans to schedule for next month.  UTD on pap.  Due for colonoscopy- pt declines at this time but will message when she is willing to have an appt.  Check labs.  Anticipatory guidance provided.

## 2018-03-26 NOTE — Assessment & Plan Note (Signed)
Ongoing issue.  Pt has gained 23 lbs since September 2018.  Stressed need for healthy diet and regular exercise.  Will check labs to risk stratify.

## 2018-03-26 NOTE — Assessment & Plan Note (Signed)
Chronic problem.  Pt did not tolerate Metformin due to diarrhea.  Overdue for eye exam but this is scheduled.  UTD on foot exam.  On ARB for renal protection.  Stressed need for healthy diet and regular exercise.  Check labs.  Adjust meds prn

## 2018-03-27 NOTE — Progress Notes (Signed)
Vanessa ScaleZach Mercie [facility] D.O. [facility] [address] [place], [place] [address] Phone: [phone]) [phone] Subjective:    I'm seeing this patient by the request  of:  Vanessa Hatchabori, Katherine E, MD   CC: Left foot pain and swelling  BJY:NWGNFAOZHYHPI:Subjective  Vanessa RogerDeborah S Crawford is a 57 y.o. female coming in with complaint of left foot pain and swelling.  Past medical history is significant for stress fracture of the left foot.  Patient was last seen greater than a year ago.  Patient states she hit her big toe. States it jammed into the top of her foot (metatarsals) and now the foot is swollen. Toe feels better. Has some numbness and tingling on the ball of her foot (middle 2 toes).  Onset- Chronic Location- lateral foot, midfoot Duration-  Character- Achy, throbbing Aggravating factors- Squatting, standing on toes, walking  Reliving factors-  Therapies tried- Ice, warm soak Severity-7 out of 10     Past Medical History:  Diagnosis Date  . Allergy    RHINITIS  . Depression   . Endometriosis   . Hyperlipidemia   . Hypertension    Past Surgical History:  Procedure Laterality Date  . SEPTOPLASTY    . TONSILLECTOMY     Social History   Socioeconomic History  . Marital status: Married    Spouse name: Not on file  . Number of children: 2  . Years of education: Not on file  . Highest education level: Not on file  Occupational History  . Occupation: Furniture conservator/restorerTEAM MANAGER  Social Needs  . Financial resource strain: Not on file  . Food insecurity:    Worry: Not on file    Inability: Not on file  . Transportation needs:    Medical: Not on file    Non-medical: Not on file  Tobacco Use  . Smoking status: Never Smoker  . Smokeless tobacco: Never Used  Substance and Sexual Activity  . Alcohol use: No  . Drug use: No  . Sexual activity: Not on file  Lifestyle  . Physical activity:    Days per week: Not on file    Minutes per session: Not on file  . Stress: Not on file  Relationships  . Social  connections:    Talks on phone: Not on file    Gets together: Not on file    Attends religious service: Not on file    Active member of club or organization: Not on file    Attends meetings of clubs or organizations: Not on file    Relationship status: Not on file  Other Topics Concern  . Not on file  Social History Narrative   REG EXERCISE         Allergies  Allergen Reactions  . Aspirin     REACTION: GI upset  . Penicillins     REACTION: unknown rxn to generic pcn  . Sulfonamide Derivatives     REACTION: unknown reaction   Family History  Problem Relation Age of Onset  . Alzheimer's disease Father   . Hypertension Father   . Alzheimer's disease Maternal Grandmother   . Cancer Paternal Grandmother      Past medical history, social, surgical and family history all reviewed in electronic medical record.  No pertanent information unless stated regarding to the chief complaint.   Review of Systems:Review of systems updated and as accurate as of 03/29/18  No headache, visual changes, nausea, vomiting, diarrhea, constipation, dizziness, abdominal pain, skin rash, fevers, chills, night sweats, weight  loss, swollen lymph nodes, body aches, jmuscle aches, chest pain, shortness of breath, mood changes.  Positive foot swelling  Objective  Blood pressure 110/80, pulse 93, height 5\' 8"  (1.727 m), weight 278 lb (126.1 kg), last menstrual period 09/16/2012, SpO2 97 %. Systems examined below as of 03/29/18   General: No apparent distress alert and oriented x3 mood and affect normal, dressed appropriately.  HEENT: Pupils equal, extraocular movements intact  Respiratory: Patient's speak in full sentences and does not appear short of breath  Cardiovascular: No lower extremity edema, non tender, no erythema  Skin: Warm dry intact with no signs of infection or rash on extremities or on axial skeleton.  Abdomen: Soft nontender  Neuro: Cranial nerves II through XII are intact,  neurovascularly intact in all extremities with 2+ DTRs and 2+ pulses.  Lymph: No lymphadenopathy of posterior or anterior cervical chain or axillae bilaterally.  Gait antalgic gait MSK:  Non tender with full range of motion and good stability and symmetric strength and tone of shoulders, elbows, wrist, hip, knee and ankles bilaterally.  Left foot exam shows the patient does have some mild swelling of the dorsal aspect of the foot.  Some mild deformity of the fifth metatarsal noted.  Neurovascularly intact.  Nontender to palpation over the fifth and fourth metatarsals proximally.  No crepitus noted.  Positive squeeze test.  Breakdown the transverse and longitudinal arch in increased compared to the contralateral side  Limited musculoskeletal ultrasound was performed and interpreted by Judi Saa  Limited ultrasound of the left foot shows that patient does have cortical defect of the fourth and fifth metatarsals with overlying hypoechoic changes and increasing Doppler flow.  Minimal callus formation noted.  Patient does have a reactive neuroma in the area as well. Impression: Fourth and fifth metatarsal fracture with a neuroma noted between the fourth and fifth    Impression and Recommendations:     This case required medical decision making of moderate complexity.      Note: This dictation was prepared with Dragon dictation along with smaller phrase technology. Any transcriptional errors that result from this process are unintentional.

## 2018-03-29 ENCOUNTER — Ambulatory Visit: Payer: BLUE CROSS/BLUE SHIELD | Admitting: Family Medicine

## 2018-03-29 ENCOUNTER — Encounter: Payer: Self-pay | Admitting: Family Medicine

## 2018-03-29 ENCOUNTER — Ambulatory Visit: Payer: Self-pay

## 2018-03-29 VITALS — BP 110/80 | HR 93 | Ht 68.0 in | Wt 278.0 lb

## 2018-03-29 DIAGNOSIS — S92355A Nondisplaced fracture of fifth metatarsal bone, left foot, initial encounter for closed fracture: Secondary | ICD-10-CM | POA: Insufficient documentation

## 2018-03-29 DIAGNOSIS — M79672 Pain in left foot: Secondary | ICD-10-CM

## 2018-03-29 MED ORDER — GABAPENTIN 100 MG PO CAPS: 200.0000 mg | ORAL_CAPSULE | Freq: Every day | ORAL | 3 refills | Status: DC

## 2018-03-29 MED ORDER — VITAMIN D (ERGOCALCIFEROL) 1.25 MG (50000 UNIT) PO CAPS: 50000.0000 [IU] | ORAL_CAPSULE | ORAL | 0 refills | Status: DC

## 2018-03-29 NOTE — Assessment & Plan Note (Signed)
Fifth and fourth metatarsal fractures noted.  Patient also has a reactive normal.  Discussed with patient in great length about icing regimen, home exercise, which activities to doing which wants to avoid.  Patient was to start increasing activity slowly over the course of next several days.  We discussed postop boot.  Vitamin D and other supplements that could be beneficial.

## 2018-03-29 NOTE — Patient Instructions (Signed)
Good to see you  Vanessa Crawford is your friend. Ice 20 minutes 2 times daily. Usually after activity and before bed. pennsaid pinkie amount topically 2 times daily as needed.  Once weekly vitamin D for 12 weeks Gabapentin 200mg  at ngiht Try the post op boot or look at [employer], ken, Merrell sandals.  Avoid being barefoot.  See me again in 3-4 weeks

## 2018-03-31 ENCOUNTER — Other Ambulatory Visit: Payer: Self-pay | Admitting: General Practice

## 2018-03-31 MED ORDER — EMPAGLIFLOZIN 10 MG PO TABS: 10.0000 mg | ORAL_TABLET | Freq: Every day | ORAL | 6 refills | Status: DC

## 2018-03-31 MED ORDER — VITAMIN D (ERGOCALCIFEROL) 1.25 MG (50000 UNIT) PO CAPS: 50000.0000 [IU] | ORAL_CAPSULE | ORAL | 0 refills | Status: DC

## 2018-04-12 DIAGNOSIS — E119 Type 2 diabetes mellitus without complications: Secondary | ICD-10-CM | POA: Diagnosis not present

## 2018-04-12 DIAGNOSIS — H2513 Age-related nuclear cataract, bilateral: Secondary | ICD-10-CM | POA: Diagnosis not present

## 2018-04-12 DIAGNOSIS — H35033 Hypertensive retinopathy, bilateral: Secondary | ICD-10-CM | POA: Diagnosis not present

## 2018-04-12 LAB — HM DIABETES EYE EXAM

## 2018-04-13 ENCOUNTER — Encounter: Payer: Self-pay | Admitting: General Practice

## 2018-04-28 NOTE — Progress Notes (Signed)
/  Terrilee Files D.O. [facility] [address] [place], [place] [address] Phone: [phone] Subjective:     CC: Foot pain follow-up  BJY:NWGNFAOZHY  Vanessa Crawford is a 57 y.o. female coming in with complaint of foot pain.  Patient was seen previously and was diagnosed with a stress fracture of the left foot.  Patient was making progress very slowly.  Patient states that she continues to improve but it is much lower than last exam.  Continues to vitamins and all the medications.  Continues the postop shoe.     Past Medical History:  Diagnosis Date  . Allergy    RHINITIS  . Depression   . Endometriosis   . Hyperlipidemia   . Hypertension    Past Surgical History:  Procedure Laterality Date  . SEPTOPLASTY    . TONSILLECTOMY     Social History   Socioeconomic History  . Marital status: Married    Spouse name: Not on file  . Number of children: 2  . Years of education: Not on file  . Highest education level: Not on file  Occupational History  . Occupation: Furniture conservator/restorer  Social Needs  . Financial resource strain: Not on file  . Food insecurity:    Worry: Not on file    Inability: Not on file  . Transportation needs:    Medical: Not on file    Non-medical: Not on file  Tobacco Use  . Smoking status: Never Smoker  . Smokeless tobacco: Never Used  Substance and Sexual Activity  . Alcohol use: No  . Drug use: No  . Sexual activity: Not on file  Lifestyle  . Physical activity:    Days per week: Not on file    Minutes per session: Not on file  . Stress: Not on file  Relationships  . Social connections:    Talks on phone: Not on file    Gets together: Not on file    Attends religious service: Not on file    Active member of club or organization: Not on file    Attends meetings of clubs or organizations: Not on file    Relationship status: Not on file  Other Topics Concern  . Not on file  Social History Narrative   REG EXERCISE          Allergies  Allergen Reactions  . Aspirin     REACTION: GI upset  . Penicillins     REACTION: unknown rxn to generic pcn  . Sulfonamide Derivatives     REACTION: unknown reaction   Family History  Problem Relation Age of Onset  . Alzheimer's disease Father   . Hypertension Father   . Alzheimer's disease Maternal Grandmother   . Cancer Paternal Grandmother      Past medical history, social, surgical and family history all reviewed in electronic medical record.  No pertanent information unless stated regarding to the chief complaint.   Review of Systems:Review of systems updated and as accurate as of 04/28/18  No headache, visual changes, nausea, vomiting, diarrhea, constipation, dizziness, abdominal pain, skin rash, fevers, chills, night sweats, weight loss, swollen lymph nodes, body aches, joint swelling, chest pain, shortness of breath, mood changes.  Positive muscle aches  Objective  Last menstrual period 09/16/2012. Systems examined below as of 04/28/18   General: No apparent distress alert and oriented x3 mood and affect normal, dressed appropriately.  HEENT: Pupils equal, extraocular movements intact  Respiratory: Patient's speak in full sentences  and does not appear short of breath  Cardiovascular: No lower extremity edema, non tender, no erythema  Skin: Warm dry intact with no signs of infection or rash on extremities or on axial skeleton.  Abdomen: Soft nontender  Neuro: Cranial nerves II through XII are intact, neurovascularly intact in all extremities with 2+ DTRs and 2+ pulses.  Lymph: No lymphadenopathy of posterior or anterior cervical chain or axillae bilaterally.  Gait normal with good balance and coordination.  MSK:  Non tender with full range of motion and good stability and symmetric strength and tone of shoulders, elbows, wrist, hip, knee and ankles bilaterally.  Left foot exam shows the patient does have breakdown of the transverse arch.  Mild discomfort  over the fourth and fifth metatarsals distally and proximally.  Significantly less swelling no noted in the area.  Limited muscular skeletal ultrasound was performed and interpreted by Judi Saa  Limited ultrasound of patient's left foot shows the patient's stress reaction has improved.  Patient does have a reactive neuroma in the vicinity.  Significant increasing neovascularization some Doppler flow still noted. Impression: Healed stress fracture but neuroma.   Impression and Recommendations:     This case required medical decision making of moderate complexity.      Note: This dictation was prepared with Dragon dictation along with smaller phrase technology. Any transcriptional errors that result from this process are unintentional.

## 2018-04-29 ENCOUNTER — Encounter: Payer: Self-pay | Admitting: Family Medicine

## 2018-04-29 ENCOUNTER — Ambulatory Visit: Payer: BLUE CROSS/BLUE SHIELD | Admitting: Family Medicine

## 2018-04-29 ENCOUNTER — Ambulatory Visit: Payer: Self-pay

## 2018-04-29 VITALS — BP 110/74 | HR 79 | Ht 68.0 in | Wt 275.0 lb

## 2018-04-29 DIAGNOSIS — M79672 Pain in left foot: Secondary | ICD-10-CM

## 2018-04-29 DIAGNOSIS — S92355A Nondisplaced fracture of fifth metatarsal bone, left foot, initial encounter for closed fracture: Secondary | ICD-10-CM

## 2018-04-29 MED ORDER — GABAPENTIN 300 MG PO CAPS: ORAL_CAPSULE | ORAL | 3 refills | Status: DC

## 2018-04-29 NOTE — Assessment & Plan Note (Signed)
Patient does have a healing aspect of the fifth metatarsal at this time the patient does have a reactive neuroma within the third interdigital space.  We discussed with patient about the potential for injection.  Discussed metatarsal pad, proper releasing of shoes, increase gabapentin to 300 mill grams at night.  Follow-up again in 3 to 4 weeks and if not better consider injection

## 2018-04-29 NOTE — Patient Instructions (Signed)
Good to see you  Vanessa Crawford is your friend.  Increase gabapentin  at night Still wear the special shoe until memorial day.  If in tennis shoes leave laces loose near the toe.  See me again in 3 weeks to make sure the nerve is better or we will try an injection

## 2018-05-18 NOTE — Progress Notes (Signed)
[name] [address] [place], [place] [address] Phone: [phone] Subjective:     CC: Left foot pain   [id number]  Vanessa Crawford is a 57 y.o. female coming in with complaint of left foot pain patient was found to have a stress fracture.  Patient's on last ultrasound showed that was well-healed on the fifth metatarsal.  Did have a mild reactive neuroma.  States that she is doing 97% better at this time.  No significant pain.  Able to do daily activities.  Does notice it at the end of a long day.       Past Medical History:  Diagnosis Date  . Allergy    RHINITIS  . Depression   . Endometriosis   . Hyperlipidemia   . Hypertension    Past Surgical History:  Procedure Laterality Date  . SEPTOPLASTY    . TONSILLECTOMY     Social History   Socioeconomic History  . Marital status: Married    Spouse name: Not on file  . Number of children: 2  . Years of education: Not on file  . Highest education level: Not on file  Occupational History  . Occupation: Furniture conservator/restorer  Social Needs  . Financial resource strain: Not on file  . Food insecurity:    Worry: Not on file    Inability: Not on file  . Transportation needs:    Medical: Not on file    Non-medical: Not on file  Tobacco Use  . Smoking status: Never Smoker  . Smokeless tobacco: Never Used  Substance and Sexual Activity  . Alcohol use: No  . Drug use: No  . Sexual activity: Not on file  Lifestyle  . Physical activity:    Days per week: Not on file    Minutes per session: Not on file  . Stress: Not on file  Relationships  . Social connections:    Talks on phone: Not on file    Gets together: Not on file    Attends religious service: Not on file    Active member of club or organization: Not on file    Attends meetings of clubs or organizations: Not on file    Relationship status: Not on file  Other Topics Concern  . Not on file  Social History Narrative   REG  EXERCISE         Allergies  Allergen Reactions  . Aspirin     REACTION: GI upset  . Penicillins     REACTION: unknown rxn to generic pcn  . Sulfonamide Derivatives     REACTION: unknown reaction   Family History  Problem Relation Age of Onset  . Alzheimer's disease Father   . Hypertension Father   . Alzheimer's disease Maternal Grandmother   . Cancer Paternal Grandmother      Past medical history, social, surgical and family history all reviewed in electronic medical record.  No pertanent information unless stated regarding to the chief complaint.   Review of Systems:Review of systems updated and as accurate as of 05/20/18  No headache, visual changes, nausea, vomiting, diarrhea, constipation, dizziness, abdominal pain, skin rash, fevers, chills, night sweats, weight loss, swollen lymph nodes, body aches, joint swelling, muscle aches, chest pain, shortness of breath, mood changes.    Objective  Blood pressure 140/74, pulse 94, height 5\' 8"  (1.727 m), weight 277 lb (125.6 kg), last menstrual period 09/16/2012, SpO2 97 %. Systems examined below as of 05/20/18  General: No apparent distress alert and oriented x3 mood and affect normal, dressed appropriately.  HEENT: Pupils equal, extraocular movements intact  Respiratory: Patient's speak in full sentences and does not appear short of breath  Cardiovascular: No lower extremity edema, non tender, no erythema  Skin: Warm dry intact with no signs of infection or rash on extremities or on axial skeleton.  Abdomen: Soft nontender  Neuro: Cranial nerves II through XII are intact, neurovascularly intact in all extremities with 2+ DTRs and 2+ pulses.  Lymph: No lymphadenopathy of posterior or anterior cervical chain or axillae bilaterally.  Gait normal with good balance and coordination.  MSK:  Non tender with full range of motion and good stability and symmetric strength and tone of shoulders, elbows, wrist, hip, knee and ankles  bilaterally.  Foot examination breakdown of the transverse arch toe.  Mild pain in the fourth and fifth metatarsal with mild positive squeeze test.  Patient does have overpronation of the hindfoot.  Nontender of the ankle with full range of motion     Impression and Recommendations:     This case required medical decision making of moderate complexity.      Note: This dictation was prepared with Dragon dictation along with smaller phrase technology. Any transcriptional errors that result from this process are unintentional.

## 2018-05-20 ENCOUNTER — Ambulatory Visit: Payer: BLUE CROSS/BLUE SHIELD | Admitting: Family Medicine

## 2018-05-20 ENCOUNTER — Encounter: Payer: Self-pay | Admitting: Family Medicine

## 2018-05-20 DIAGNOSIS — S92355A Nondisplaced fracture of fifth metatarsal bone, left foot, initial encounter for closed fracture: Secondary | ICD-10-CM

## 2018-05-20 NOTE — Patient Instructions (Signed)
Good to see you  Vanessa Crawford is your friend.  Keep working on the good shoes I am so happy you are better See me when you need me  [phone]

## 2018-05-20 NOTE — Assessment & Plan Note (Signed)
Well-healed.  Reactive neuroma.  Discussed icing regimen.  Follow-up as needed

## 2018-06-09 IMAGING — DX DG FOOT COMPLETE 3+V*L*
3 series · 3 of 3 positions shown · non-contrast
Comparison: None available.

CLINICAL DATA: Initial evaluation for dorsal foot pain radiating
medially for 1 month. No injury.

EXAM:
LEFT FOOT - COMPLETE 3+ VIEW

[foot ap]
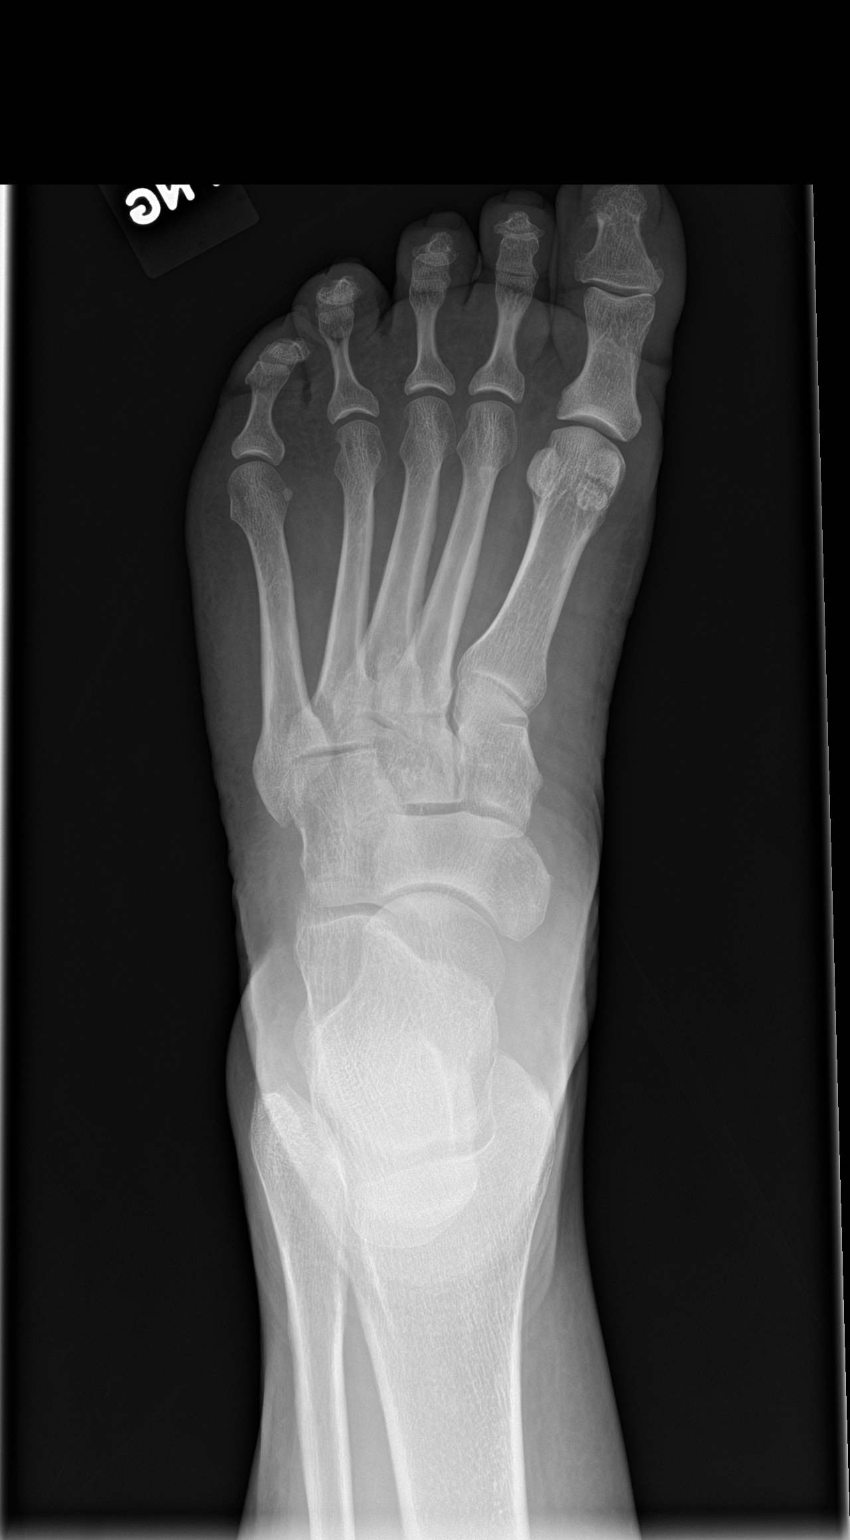

[foot obl]
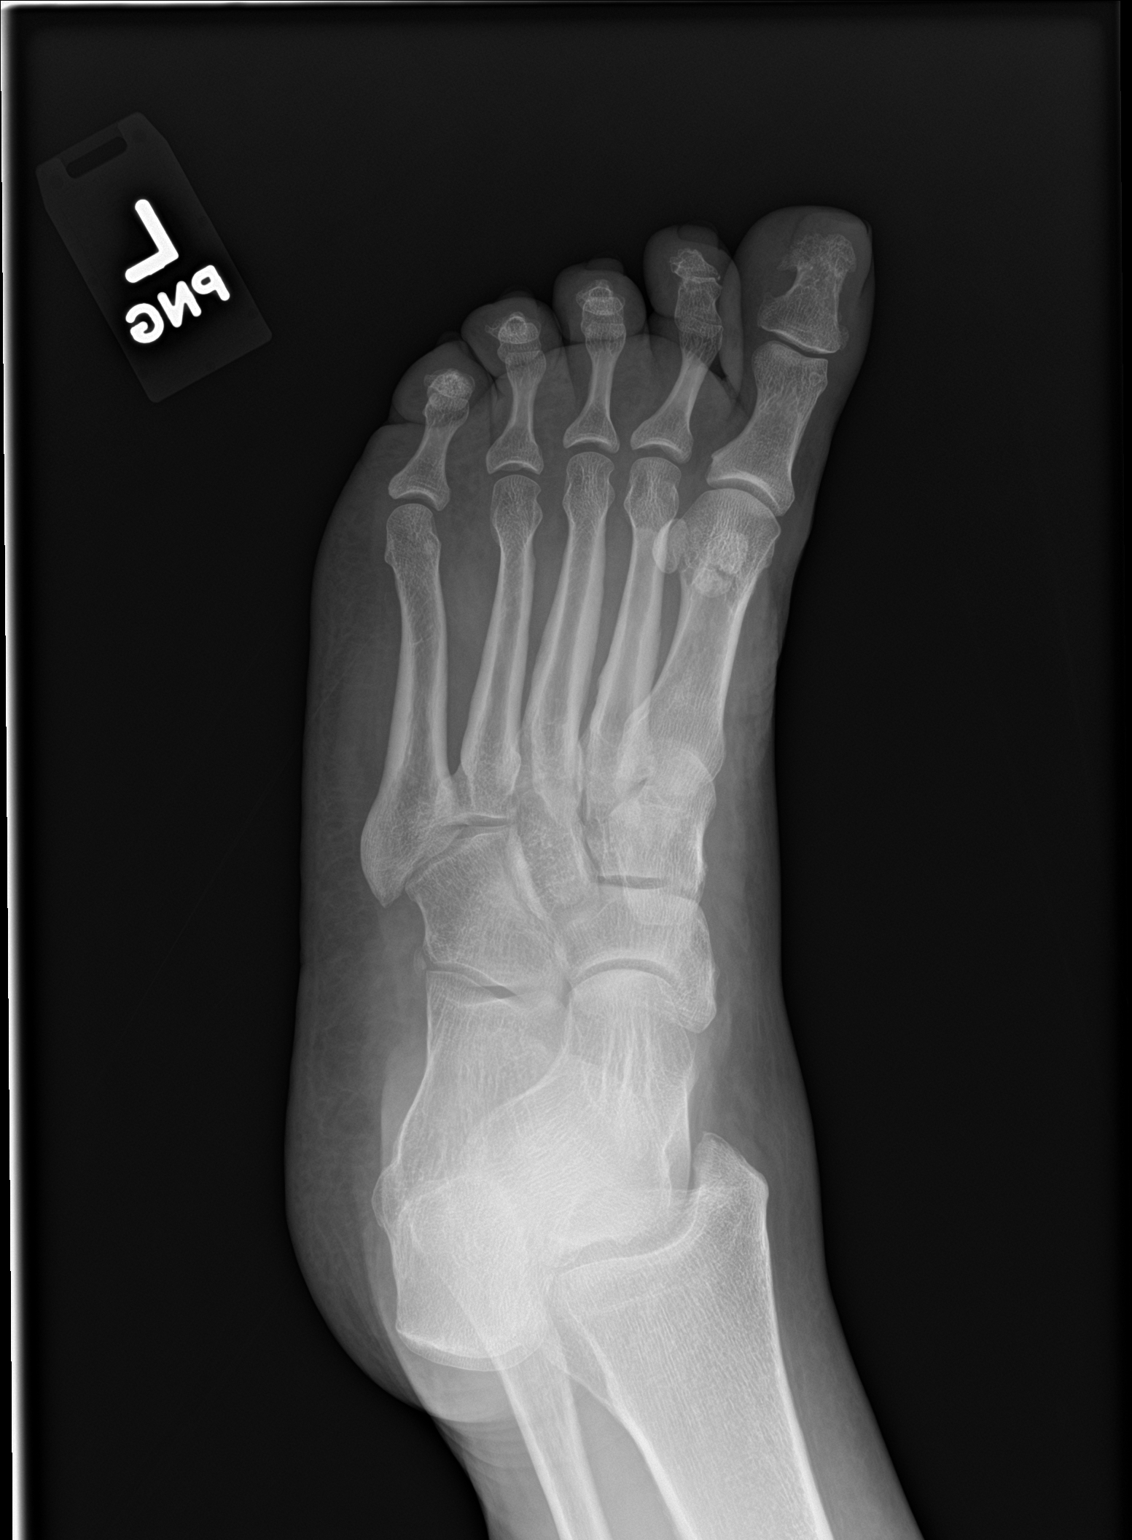

[foot lat]
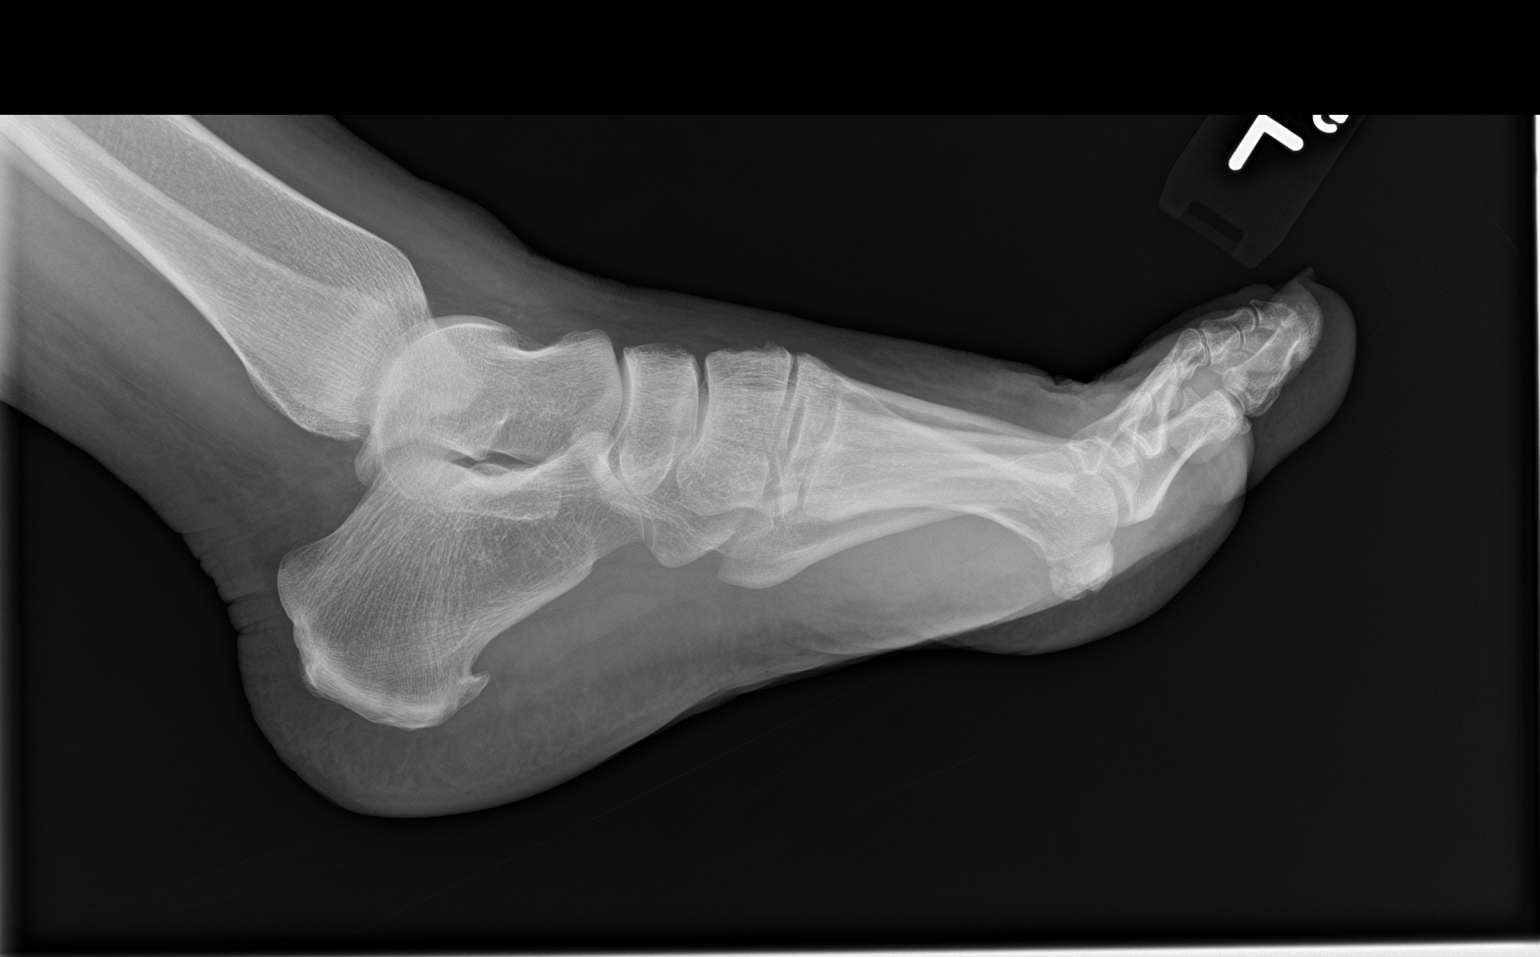

[3 of 3 positions shown; findings below may reference images not displayed]

FINDINGS: No acute fracture or dislocation. No evidence for chronic fracture.
Mild degenerative spurring at the dorsal midfoot. The joint spaces
are otherwise fairly well maintained without evidence for
significant degenerative or erosive arthropathy. Plantar calcaneal
enthesophyte noted. Osseous mineralization normal. No soft tissue
abnormality.
IMPRESSION: 1. Mild degenerative spurring at the dorsal midfoot. Plantar
calcaneal enthesophyte.
2. Otherwise negative radiograph of the left foot. No acute osseous
abnormality identified.

## 2018-06-23 DIAGNOSIS — G5602 Carpal tunnel syndrome, left upper limb: Secondary | ICD-10-CM | POA: Diagnosis not present

## 2018-06-23 DIAGNOSIS — M25551 Pain in right hip: Secondary | ICD-10-CM | POA: Diagnosis not present

## 2018-06-23 DIAGNOSIS — M7712 Lateral epicondylitis, left elbow: Secondary | ICD-10-CM | POA: Diagnosis not present

## 2018-06-23 DIAGNOSIS — M542 Cervicalgia: Secondary | ICD-10-CM | POA: Diagnosis not present

## 2018-06-26 ENCOUNTER — Other Ambulatory Visit: Payer: Self-pay | Admitting: Family Medicine

## 2018-06-29 DIAGNOSIS — M25551 Pain in right hip: Secondary | ICD-10-CM | POA: Diagnosis not present

## 2018-06-29 DIAGNOSIS — M542 Cervicalgia: Secondary | ICD-10-CM | POA: Diagnosis not present

## 2018-06-29 DIAGNOSIS — M7712 Lateral epicondylitis, left elbow: Secondary | ICD-10-CM | POA: Diagnosis not present

## 2018-06-29 DIAGNOSIS — G5602 Carpal tunnel syndrome, left upper limb: Secondary | ICD-10-CM | POA: Diagnosis not present

## 2018-07-07 DIAGNOSIS — M7712 Lateral epicondylitis, left elbow: Secondary | ICD-10-CM | POA: Diagnosis not present

## 2018-07-07 DIAGNOSIS — M542 Cervicalgia: Secondary | ICD-10-CM | POA: Diagnosis not present

## 2018-07-07 DIAGNOSIS — M25551 Pain in right hip: Secondary | ICD-10-CM | POA: Diagnosis not present

## 2018-07-07 DIAGNOSIS — G5602 Carpal tunnel syndrome, left upper limb: Secondary | ICD-10-CM | POA: Diagnosis not present

## 2018-07-15 DIAGNOSIS — M25551 Pain in right hip: Secondary | ICD-10-CM | POA: Diagnosis not present

## 2018-07-15 DIAGNOSIS — G5602 Carpal tunnel syndrome, left upper limb: Secondary | ICD-10-CM | POA: Diagnosis not present

## 2018-07-15 DIAGNOSIS — M542 Cervicalgia: Secondary | ICD-10-CM | POA: Diagnosis not present

## 2018-07-15 DIAGNOSIS — M7712 Lateral epicondylitis, left elbow: Secondary | ICD-10-CM | POA: Diagnosis not present

## 2018-07-20 DIAGNOSIS — D485 Neoplasm of uncertain behavior of skin: Secondary | ICD-10-CM | POA: Diagnosis not present

## 2018-07-20 DIAGNOSIS — L438 Other lichen planus: Secondary | ICD-10-CM | POA: Diagnosis not present

## 2018-07-20 DIAGNOSIS — D1801 Hemangioma of skin and subcutaneous tissue: Secondary | ICD-10-CM | POA: Diagnosis not present

## 2018-07-20 DIAGNOSIS — D225 Melanocytic nevi of trunk: Secondary | ICD-10-CM | POA: Diagnosis not present

## 2018-07-20 DIAGNOSIS — L821 Other seborrheic keratosis: Secondary | ICD-10-CM | POA: Diagnosis not present

## 2018-07-21 DIAGNOSIS — G5602 Carpal tunnel syndrome, left upper limb: Secondary | ICD-10-CM | POA: Diagnosis not present

## 2018-07-21 DIAGNOSIS — M7712 Lateral epicondylitis, left elbow: Secondary | ICD-10-CM | POA: Diagnosis not present

## 2018-07-21 DIAGNOSIS — M25551 Pain in right hip: Secondary | ICD-10-CM | POA: Diagnosis not present

## 2018-07-21 DIAGNOSIS — M542 Cervicalgia: Secondary | ICD-10-CM | POA: Diagnosis not present

## 2018-07-28 DIAGNOSIS — M542 Cervicalgia: Secondary | ICD-10-CM | POA: Diagnosis not present

## 2018-07-28 DIAGNOSIS — M7712 Lateral epicondylitis, left elbow: Secondary | ICD-10-CM | POA: Diagnosis not present

## 2018-07-28 DIAGNOSIS — M25551 Pain in right hip: Secondary | ICD-10-CM | POA: Diagnosis not present

## 2018-07-28 DIAGNOSIS — G5602 Carpal tunnel syndrome, left upper limb: Secondary | ICD-10-CM | POA: Diagnosis not present

## 2018-07-30 ENCOUNTER — Ambulatory Visit: Payer: BLUE CROSS/BLUE SHIELD | Admitting: Family Medicine

## 2018-07-30 ENCOUNTER — Other Ambulatory Visit: Payer: Self-pay

## 2018-07-30 ENCOUNTER — Encounter: Payer: Self-pay | Admitting: Family Medicine

## 2018-07-30 VITALS — BP 124/88 | HR 84 | Temp 98.2°F | Resp 17 | Ht 68.0 in | Wt 270.4 lb

## 2018-07-30 DIAGNOSIS — E119 Type 2 diabetes mellitus without complications: Secondary | ICD-10-CM

## 2018-07-30 NOTE — Patient Instructions (Signed)
Follow up in 3-4 months to recheck diabetes, BP, and cholesterol We'll notify you of your lab results and make any changes if needed Continue to work on healthy diet and regular exercise- you're doing great!! Call with any questions or concerns Have a great weekend!

## 2018-07-30 NOTE — Progress Notes (Signed)
   Subjective:    Patient ID: Vanessa Crawford, female    DOB: 05-29-1961, 57 y.o.   MRN: [id number]  HPI DM- chronic problem, on Jardiance 10mg  daily.  Last A1C 9.6  UTD on eye exam.  On ARB for renal protection.  Due for foot exam next month.  Pt is down 7 lbs since last visit.  No CP, SOB, HAs, visual changes, abd pain, N/V, edema.  No numbness/tingling of hands/feet.  Denies symptomatic lows.  No regular exercise.   Review of Systems For ROS see HPI     Objective:   Physical Exam  Constitutional: She is oriented to person, place, and time. She appears well-developed and well-nourished. No distress.  HENT:  Head: Normocephalic and atraumatic.  Eyes: Pupils are equal, round, and reactive to light. Conjunctivae and EOM are normal.  Neck: Normal range of motion. Neck supple. No thyromegaly present.  Cardiovascular: Normal rate, regular rhythm, normal heart sounds and intact distal pulses.  No murmur heard. Pulmonary/Chest: Effort normal and breath sounds normal. No respiratory distress.  Abdominal: Soft. She exhibits no distension. There is no tenderness.  Musculoskeletal: She exhibits no edema.  Lymphadenopathy:    She has no cervical adenopathy.  Neurological: She is alert and oriented to person, place, and time.  Skin: Skin is warm and dry.  Psychiatric: She has a normal mood and affect. Her behavior is normal.  Vitals reviewed.         Assessment & Plan:

## 2018-07-30 NOTE — Assessment & Plan Note (Signed)
Chronic problem.  Tolerating Jardiance w/o difficulty.  UTD on eye exam.  Foot exam done today.  On ARB for renal protection.  Applauded her efforts at weight loss.  Stressed need for ongoing healthy diet and regular exercise.  Check labs.  Adjust meds prn

## 2018-07-31 LAB — BASIC METABOLIC PANEL
BUN: 15 mg/dL (ref 7–25)
CALCIUM: 9.4 mg/dL (ref 8.6–10.4)
CO2: 20 mmol/L (ref 20–32)
Chloride: 105 mmol/L (ref 98–110)
Creat: 0.88 mg/dL (ref 0.50–1.05)
GLUCOSE: 141 mg/dL — AB (ref 65–99)
Potassium: 3.9 mmol/L (ref 3.5–5.3)
SODIUM: 137 mmol/L (ref 135–146)

## 2018-07-31 LAB — HEMOGLOBIN A1C
EAG (MMOL/L): 11.9 (calc)
HEMOGLOBIN A1C: 9.1 %{Hb} — AB (ref <5.7)
Mean Plasma Glucose: 214 (calc)

## 2018-08-03 ENCOUNTER — Other Ambulatory Visit: Payer: Self-pay | Admitting: General Practice

## 2018-08-03 MED ORDER — DULAGLUTIDE 0.75 MG/0.5ML ~~LOC~~ SOAJ: 1.0000 "pen " | SUBCUTANEOUS | 3 refills | Status: DC

## 2018-08-06 DIAGNOSIS — M7712 Lateral epicondylitis, left elbow: Secondary | ICD-10-CM | POA: Diagnosis not present

## 2018-08-06 DIAGNOSIS — G5602 Carpal tunnel syndrome, left upper limb: Secondary | ICD-10-CM | POA: Diagnosis not present

## 2018-08-06 DIAGNOSIS — M542 Cervicalgia: Secondary | ICD-10-CM | POA: Diagnosis not present

## 2018-08-06 DIAGNOSIS — M25551 Pain in right hip: Secondary | ICD-10-CM | POA: Diagnosis not present

## 2018-08-09 DIAGNOSIS — Z6841 Body Mass Index (BMI) 40.0 and over, adult: Secondary | ICD-10-CM | POA: Diagnosis not present

## 2018-08-09 DIAGNOSIS — Z01419 Encounter for gynecological examination (general) (routine) without abnormal findings: Secondary | ICD-10-CM | POA: Diagnosis not present

## 2018-08-09 DIAGNOSIS — Z1231 Encounter for screening mammogram for malignant neoplasm of breast: Secondary | ICD-10-CM | POA: Diagnosis not present

## 2018-08-20 DIAGNOSIS — M25551 Pain in right hip: Secondary | ICD-10-CM | POA: Diagnosis not present

## 2018-08-20 DIAGNOSIS — M7712 Lateral epicondylitis, left elbow: Secondary | ICD-10-CM | POA: Diagnosis not present

## 2018-08-20 DIAGNOSIS — M542 Cervicalgia: Secondary | ICD-10-CM | POA: Diagnosis not present

## 2018-08-20 DIAGNOSIS — G5602 Carpal tunnel syndrome, left upper limb: Secondary | ICD-10-CM | POA: Diagnosis not present

## 2018-08-25 DIAGNOSIS — M25551 Pain in right hip: Secondary | ICD-10-CM | POA: Diagnosis not present

## 2018-08-25 DIAGNOSIS — M7712 Lateral epicondylitis, left elbow: Secondary | ICD-10-CM | POA: Diagnosis not present

## 2018-08-25 DIAGNOSIS — G5602 Carpal tunnel syndrome, left upper limb: Secondary | ICD-10-CM | POA: Diagnosis not present

## 2018-08-25 DIAGNOSIS — M542 Cervicalgia: Secondary | ICD-10-CM | POA: Diagnosis not present

## 2018-09-30 DIAGNOSIS — G4733 Obstructive sleep apnea (adult) (pediatric): Secondary | ICD-10-CM | POA: Diagnosis not present

## 2018-10-01 ENCOUNTER — Other Ambulatory Visit: Payer: Self-pay | Admitting: Family Medicine

## 2018-10-23 ENCOUNTER — Other Ambulatory Visit: Payer: Self-pay | Admitting: Family Medicine

## 2018-11-05 ENCOUNTER — Ambulatory Visit: Payer: BLUE CROSS/BLUE SHIELD | Admitting: Family Medicine

## 2018-11-29 ENCOUNTER — Ambulatory Visit: Payer: BLUE CROSS/BLUE SHIELD | Admitting: Family Medicine

## 2018-11-29 ENCOUNTER — Encounter: Payer: Self-pay | Admitting: Family Medicine

## 2018-11-29 ENCOUNTER — Other Ambulatory Visit: Payer: Self-pay

## 2018-11-29 VITALS — BP 121/83 | HR 81 | Temp 98.4°F | Resp 17 | Ht 68.0 in | Wt 270.2 lb

## 2018-11-29 DIAGNOSIS — E781 Pure hyperglyceridemia: Secondary | ICD-10-CM | POA: Diagnosis not present

## 2018-11-29 DIAGNOSIS — I10 Essential (primary) hypertension: Secondary | ICD-10-CM | POA: Diagnosis not present

## 2018-11-29 DIAGNOSIS — E119 Type 2 diabetes mellitus without complications: Secondary | ICD-10-CM

## 2018-11-29 NOTE — Assessment & Plan Note (Signed)
Chronic problem.  Pt reports she has not been eating well nor exercising.  UTD on foot exam, eye exam.  On ARB for renal protection.  Stressed need for healthy diet and regular exercise.  Check labs.  Adjust meds prn

## 2018-11-29 NOTE — Assessment & Plan Note (Signed)
Chronic problem.  Currently well controlled.  Asymptomatic.  Check labs.  No anticipated med changes.  Will follow. 

## 2018-11-29 NOTE — Patient Instructions (Signed)
Follow up in 3-4 months to recheck diabetes We'll notify you of your lab results and make any changes if needed CALL AND SCHEDULE YOUR COLONOSCOPY Continue to work on healthy diet and regular exercise- you can do it! Call with any questions or concerns Happy Holidays!!!

## 2018-11-29 NOTE — Assessment & Plan Note (Signed)
Chronic problem.  Tolerating statin w/o difficulty.  Stressed need for healthy diet and regular exercise.  Check labs.  Adjust meds prn  

## 2018-11-29 NOTE — Progress Notes (Signed)
   Subjective:    Patient ID: Vanessa Crawford, female    DOB: 10-23-1961, 57 y.o.   MRN: [id number]  HPI HTN- chronic problem, on Losartan 100mg  daily w/ good control.  Denies CP, SOB, HAs, visual changes, edema.  DM- chronic problem, on Trulicity 0.75mg  weekly, Jardiance 10mg  daily.  On ARB for renal protection, UTD on foot exam, eye exam.  Pt has not been eating well due to multiple holiday parties.  Denies symptomatic lows.  No numbness/tingling of hands/feet.  Hyperlipidemia- chronic problem, on Lipitor 10mg  and Fenofibrate 160mg  daily.  Denies abd pain, N/V.  Obesity- BMI remains 41.  Not eating well.  Not exercising.   Review of Systems For ROS see HPI     Objective:   Physical Exam Vitals signs reviewed.  Constitutional:      General: She is not in acute distress.    Appearance: She is well-developed.     Comments: obese  HENT:     Head: Normocephalic and atraumatic.  Eyes:     Conjunctiva/sclera: Conjunctivae normal.     Pupils: Pupils are equal, round, and reactive to light.  Neck:     Musculoskeletal: Normal range of motion and neck supple.     Thyroid: No thyromegaly.  Cardiovascular:     Rate and Rhythm: Normal rate and regular rhythm.     Heart sounds: Normal heart sounds. No murmur.  Pulmonary:     Effort: Pulmonary effort is normal. No respiratory distress.     Breath sounds: Normal breath sounds.  Abdominal:     General: There is no distension.     Palpations: Abdomen is soft.     Tenderness: There is no abdominal tenderness.  Lymphadenopathy:     Cervical: No cervical adenopathy.  Skin:    General: Skin is warm and dry.  Neurological:     Mental Status: She is alert and oriented to person, place, and time.  Psychiatric:        Behavior: Behavior normal.           Assessment & Plan:

## 2018-11-30 LAB — HEPATIC FUNCTION PANEL
ALK PHOS: 114 U/L (ref 39–117)
ALT: 26 U/L (ref 0–35)
AST: 12 U/L (ref 0–37)
Albumin: 4.2 g/dL (ref 3.5–5.2)
BILIRUBIN DIRECT: 0.1 mg/dL (ref 0.0–0.3)
BILIRUBIN TOTAL: 0.5 mg/dL (ref 0.2–1.2)
Total Protein: 7.3 g/dL (ref 6.0–8.3)

## 2018-11-30 LAB — BASIC METABOLIC PANEL
BUN: 13 mg/dL (ref 6–23)
CHLORIDE: 105 meq/L (ref 96–112)
CO2: 21 meq/L (ref 19–32)
CREATININE: 0.84 mg/dL (ref 0.40–1.20)
Calcium: 9.3 mg/dL (ref 8.4–10.5)
GFR: 74.16 mL/min (ref >60.00)
Glucose, Bld: 196 mg/dL — ABNORMAL HIGH (ref 70–99)
POTASSIUM: 3.9 meq/L (ref 3.5–5.1)
Sodium: 136 mEq/L (ref 135–145)

## 2018-11-30 LAB — CBC WITH DIFFERENTIAL/PLATELET
BASOS PCT: 1 % (ref 0.0–3.0)
Basophils Absolute: 0.1 10*3/uL (ref 0.0–0.1)
Eosinophils Absolute: 0.3 10*3/uL (ref 0.0–0.7)
Eosinophils Relative: 4.3 % (ref 0.0–5.0)
HCT: 43.6 % (ref 36.0–46.0)
Hemoglobin: 14.5 g/dL (ref 12.0–15.0)
LYMPHS ABS: 2.3 10*3/uL (ref 0.7–4.0)
Lymphocytes Relative: 29.7 % (ref 12.0–46.0)
MCHC: 33.1 g/dL (ref 30.0–36.0)
MCV: 84.9 fl (ref 78.0–100.0)
MONO ABS: 0.5 10*3/uL (ref 0.1–1.0)
Monocytes Relative: 5.9 % (ref 3.0–12.0)
NEUTROS ABS: 4.6 10*3/uL (ref 1.4–7.7)
NEUTROS PCT: 59.1 % (ref 43.0–77.0)
PLATELETS: 235 10*3/uL (ref 150.0–400.0)
RBC: 5.14 Mil/uL — ABNORMAL HIGH (ref 3.87–5.11)
RDW: 15.2 % (ref 11.5–15.5)
WBC: 7.8 10*3/uL (ref 4.0–10.5)

## 2018-11-30 LAB — LIPID PANEL
Cholesterol: 128 mg/dL (ref 0–200)
HDL: 29.8 mg/dL — ABNORMAL LOW (ref >39.00)
NONHDL: 98.67
TRIGLYCERIDES: 222 mg/dL — AB (ref 0.0–149.0)
Total CHOL/HDL Ratio: 4
VLDL: 44.4 mg/dL — ABNORMAL HIGH (ref 0.0–40.0)

## 2018-11-30 LAB — LDL CHOLESTEROL, DIRECT: LDL DIRECT: 82 mg/dL

## 2018-11-30 LAB — TSH: TSH: 1.96 u[IU]/mL (ref 0.35–4.50)

## 2018-11-30 LAB — HEMOGLOBIN A1C: Hgb A1c MFr Bld: 7.6 % — ABNORMAL HIGH (ref 4.6–6.5)

## 2018-12-01 ENCOUNTER — Encounter: Payer: Self-pay | Admitting: General Practice

## 2018-12-13 DIAGNOSIS — J069 Acute upper respiratory infection, unspecified: Secondary | ICD-10-CM | POA: Diagnosis not present

## 2018-12-31 ENCOUNTER — Other Ambulatory Visit: Payer: Self-pay | Admitting: Family Medicine

## 2019-01-25 ENCOUNTER — Encounter: Payer: Self-pay | Admitting: Family Medicine

## 2019-01-31 ENCOUNTER — Ambulatory Visit: Payer: BLUE CROSS/BLUE SHIELD | Admitting: Family Medicine

## 2019-01-31 ENCOUNTER — Encounter: Payer: Self-pay | Admitting: Family Medicine

## 2019-01-31 ENCOUNTER — Other Ambulatory Visit: Payer: Self-pay

## 2019-01-31 VITALS — BP 112/81 | HR 88 | Temp 98.5°F | Resp 16 | Ht 68.0 in | Wt 276.5 lb

## 2019-01-31 DIAGNOSIS — R21 Rash and other nonspecific skin eruption: Secondary | ICD-10-CM | POA: Diagnosis not present

## 2019-01-31 DIAGNOSIS — E119 Type 2 diabetes mellitus without complications: Secondary | ICD-10-CM

## 2019-01-31 DIAGNOSIS — F339 Major depressive disorder, recurrent, unspecified: Secondary | ICD-10-CM | POA: Diagnosis not present

## 2019-01-31 MED ORDER — TRIAMCINOLONE ACETONIDE 0.1 % EX OINT: 1.0000 "application " | TOPICAL_OINTMENT | Freq: Two times a day (BID) | CUTANEOUS | 1 refills | Status: AC

## 2019-01-31 NOTE — Assessment & Plan Note (Signed)
Ongoing issue for pt.  She reports feeling anxious and overwhelmed but not sad.  She is not interested in switching meds at this time.  Will continue to follow.

## 2019-01-31 NOTE — Progress Notes (Signed)
   Subjective:    Patient ID: Vanessa Crawford, female    DOB: 30-Jul-1961, 58 y.o.   MRN: [id number]  HPI Rash- pt reports she had a 'bad reaction' to poison oak in September.  She continued to itch despite the poison improving.  Would develop intermittent 'whelps'.  Felt that the rash/hives was due to [place].  She stopped this 1 week ago and feels the rash/hives have improved each day.  Pt reports areas will 'come up for 15-20 minutes and go away'.  Will take benadryl as needed for breakouts.  Also taking Allegra daily  Diabetes- pt stopped her Jardiance b/c she thinks this is responsible for her rash.  Pt has not checked sugars since stopping the Jardiance.  Depression- ongoing issue for pt.  She reports stress level has been 'really really high'.  Dad has alzheimer's, work is overwhelming, oldest child is going through a divorce.  Husband's brother committed suicide this time of year which is hard on the family.  Obesity- pt has been stress eating.  She has gained 7 lbs since last visit.  'a lot of overtime' at work.     Review of Systems For ROS see HPI     Objective:   Physical Exam Vitals signs reviewed.  Constitutional:      General: She is not in acute distress.    Appearance: She is well-developed. She is obese.  HENT:     Head: Normocephalic and atraumatic.  Eyes:     Conjunctiva/sclera: Conjunctivae normal.     Pupils: Pupils are equal, round, and reactive to light.  Neck:     Musculoskeletal: Normal range of motion and neck supple.     Thyroid: No thyromegaly.  Cardiovascular:     Rate and Rhythm: Normal rate and regular rhythm.     Heart sounds: Normal heart sounds. No murmur.  Pulmonary:     Effort: Pulmonary effort is normal. No respiratory distress.     Breath sounds: Normal breath sounds.  Abdominal:     General: There is no distension.     Palpations: Abdomen is soft.     Tenderness: There is no abdominal tenderness.  Lymphadenopathy:     Cervical: No  cervical adenopathy.  Skin:    General: Skin is warm and dry.     Findings: Rash (erythematous urticarial lesion above R eyebrow) present.  Neurological:     Mental Status: She is alert and oriented to person, place, and time.  Psychiatric:        Behavior: Behavior normal.           Assessment & Plan:  Rash- new.  Area in question looks like a hive.  Doubt this is related to her [place] use but she feels strongly there is a cause/effect relationship there.  Suspect these transient hives are stress induced and not medication related but since she feels there is a correlation, will stop medication.  Triamcinolone ointment PRN.  Pt expressed understanding and is in agreement w/ plan.

## 2019-01-31 NOTE — Patient Instructions (Signed)
Follow up in late March/early April to recheck diabetes STOP the Jardiance Try and find a stress outlet- you deserve it! Try and stop the stress eating- especially since we stopped the Jardiance Use the Triamcinolone ointment on the itchy areas as they appear Try and get regular exercise- this will improve sugars and stress Call with any questions or concerns Hang in there!

## 2019-01-31 NOTE — Assessment & Plan Note (Signed)
Chronic problem.  Pt has stopped her Jardiance but has increased her stress eating.  Discussed that this is not a good combination for her sugars and A1C.  Stressed need for low carb diet, to stop stress eating, and start exercising.  Will follow.

## 2019-01-31 NOTE — Assessment & Plan Note (Signed)
Deteriorated.  Pt has gained another 7 lbs due to stress eating.  Discussed need for her to change her behaviors.  Will follow.

## 2019-02-21 ENCOUNTER — Other Ambulatory Visit: Payer: Self-pay | Admitting: Family Medicine

## 2019-03-21 ENCOUNTER — Encounter: Payer: Self-pay | Admitting: Family Medicine

## 2019-03-21 ENCOUNTER — Other Ambulatory Visit: Payer: Self-pay

## 2019-03-21 ENCOUNTER — Ambulatory Visit: Payer: BLUE CROSS/BLUE SHIELD | Admitting: Family Medicine

## 2019-03-21 ENCOUNTER — Ambulatory Visit (INDEPENDENT_AMBULATORY_CARE_PROVIDER_SITE_OTHER): Payer: BLUE CROSS/BLUE SHIELD | Admitting: Family Medicine

## 2019-03-21 VITALS — Temp 98.2°F | Wt 274.3 lb

## 2019-03-21 DIAGNOSIS — F339 Major depressive disorder, recurrent, unspecified: Secondary | ICD-10-CM | POA: Diagnosis not present

## 2019-03-21 DIAGNOSIS — E119 Type 2 diabetes mellitus without complications: Secondary | ICD-10-CM

## 2019-03-21 DIAGNOSIS — R200 Anesthesia of skin: Secondary | ICD-10-CM | POA: Diagnosis not present

## 2019-03-21 DIAGNOSIS — R202 Paresthesia of skin: Secondary | ICD-10-CM

## 2019-03-21 NOTE — Progress Notes (Signed)
Virtual Visit via Video   I connected with Vanessa Crawford on 03/21/19 at  8:30 AM EDT by a video enabled telemedicine application and verified that I am speaking with the correct person using two identifiers. Location patient: Home Location provider: [facility], Office Persons participating in the virtual visit: pt and myself  I discussed the limitations of evaluation and management by telemedicine and the availability of in person appointments. The patient expressed understanding and agreed to proceed.  Subjective:   HPI:  DM- chronic problem, on Trulicity.  Last A1C 7.6.  On ARB for renal protection.  UTD on foot exam, eye exam.  No CP, SOB, HAs, visual changes, abd pain, N/V.  Denies symptomatic lows.  + L hand numbness- pt feels this is work related carpal tunnel.  No numbness of feet.  Morbid obesity- pt is up to 274 lbs.  Pt reports stress eating.  Not exercising.  'i'm really getting into chocolate again'.    ROS: See pertinent positives and negatives per HPI.  Patient Active Problem List   Diagnosis Date Noted  . Nondisplaced fracture of fifth metatarsal bone, left foot, initial encounter for closed fracture 03/29/2018  . Vitamin D deficiency 03/26/2018  . Morbid obesity (HCC) 03/26/2018  . 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 Capsulitis of metatarsophalangeal (MTP) joint of left foot 04/28/2017  . Stress fracture of metatarsal bone of left foot 03/11/2017  . Physical exam 11/02/2015  . Carpal tunnel syndrome 08/03/2015  . Leukocytes in urine 12/22/2014  . 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 Eczematous skin lesions 11/23/2014  . Lumbar back pain 02/15/2014  . Hypertriglyceridemia 12/26/2013  . Diabetes mellitus type II, controlled, with no complications (HCC) 02/04/2010  . MURMUR 01/21/2010  . ULNAR NERVE ENTRAPMENT 08/02/2009  . GERD 02/28/2009  . PLANTAR FASCIITIS 12/06/2008  . HYPERTENSION, BENIGN ESSENTIAL 11/06/2008  . HERPES SIMPLEX DERMATITIS OF  EYELID 11/01/2007  . Obstructive sleep apnea 09/24/2007  . Depression, recurrent (HCC) 03/18/2007  . ALLERGIC RHINITIS 03/18/2007    Social History   Tobacco Use  . Smoking status: Never Smoker  . Smokeless tobacco: Never Used  Substance Use Topics  . Alcohol use: No    Current Outpatient Medications:  .  albuterol (PROAIR HFA) 108 (90 Base) MCG/ACT inhaler, USE 2 INHALATIONS EVERY 4 HOURS AS NEEDED (COUGH), Disp: 25.5 g, Rfl: 0 .  atorvastatin (LIPITOR) 10 MG tablet, TAKE 1 TABLET BY MOUTH EVERY DAY, Disp: 90 tablet, Rfl: 1 .  beclomethasone (QVAR) 40 MCG/ACT inhaler, Inhale 2 puffs into the lungs 2 (two) times daily., Disp: 1 Inhaler, Rfl: 2 .  Dulaglutide 0.75 MG/0.5ML SOPN, INJECT CONTENTS OF 1 PEN INTO THE SKIN EVERY 7 (SEVEN) DAYS., Disp: 6 pen, Rfl: 0 .  fenofibrate 160 MG tablet, Take 1 tablet (160 mg total) by mouth daily., Disp: 90 tablet, Rfl: 1 .  fexofenadine (ALLEGRA) 180 MG tablet, Take 1 tablet (180 mg total) by mouth daily. ONE BY MOUTH IN  AM, AND ONE BY MOUTH IN PM, Disp: 90 tablet, Rfl: 3 .  FLUoxetine (PROZAC) 40 MG capsule, TAKE ONE CAPSULE BY MOUTH EVERY DAY, Disp: 90 capsule, Rfl: 1 .  fluticasone (FLONASE) 50 MCG/ACT nasal spray, USE 2 SPRAYS IN EACH NOSTRIL DAILY (PLEASE CALL [phone] TO SCHEDULE A DIABETES FOLLOW UP), Disp: 48 g, Rfl: 0 .  losartan (COZAAR) 100 MG tablet, TAKE 1 TABLET (100 MG TOTAL) BY MOUTH DAILY., Disp: 90 tablet, Rfl: 1 .  omeprazole (PRILOSEC) 20 MG capsule, TAKE 1  CAPSULE BY MOUTH EVERY DAY, Disp: 90 capsule, Rfl: 1 .  triamcinolone ointment (KENALOG) 0.1 %, Apply 1 application topically 2 (two) times daily., Disp: 90 g, Rfl: 1 .  valACYclovir (VALTREX) 500 MG tablet, Take 1 tablet (500 mg total) by mouth 2 (two) times daily. FOR 3 DAYS, Disp: 6 tablet, Rfl: 2 .  aluminum chloride (DRYSOL) 20 % external solution, Apply topically at bedtime. (Patient not taking: Reported on 03/21/2019), Disp: 35 mL, Rfl: 0 .  Cholecalciferol (VITAMIN D) 2000  units CAPS, Take by mouth., Disp: , Rfl:  .  gabapentin (NEURONTIN) 300 MG capsule, 1 pill nightly (Patient not taking: Reported on 03/21/2019), Disp: 30 capsule, Rfl: 3  Allergies  Allergen Reactions  . Aspirin     REACTION: GI upset  . Penicillins     REACTION: unknown rxn to generic pcn  . Sulfonamide Derivatives     REACTION: unknown reaction    Objective:   Temp 98.2 F (36.8 C) (Oral)   Wt 274 lb 4.8 oz (124.4 kg)   LMP 09/16/2012   BMI 41.71 kg/m  AAOx3, NAD NCAT, EOMI No obvious CN deficits Coloring WNL Pt is able to speak clearly, coherently without shortness of breath or increased work of breathing.  Thought process is linear.  Mood is appropriate.   Assessment and Plan:   DM- chronic problem, on Trulicity w/o difficulty.  UTD on foot exam, eye exam, and on ARB for renal protection.  Admits to poor diet and no regular exercise- stressed the need to improve both.  Check labs.  Adjust meds prn   Obesity- pt is an emotional eater and having a very difficult time with the current situation.  Stressed need for healthy diet and regular exercise- to improve both physical and emotional wellbeing.  Will follow.  L hand numbness- pt feels this is due to carpal tunnel but reports all 4 fingers and palm of her hand now stay numb 'most of the time'.  Based on this, will refer to hand specialist.  She is aware that they may not be able to schedule for awhile given the COVID situation.  Depression- currently on Prozac 40mg  daily.  She doesn't feel depressed at the current time but admits to emotional eating.  Encouraged her to talk about her feelings or journal them rather than eat them.  She is going to try other avenues than eating.    Neena Rhymes, MD 03/21/2019

## 2019-03-21 NOTE — Progress Notes (Signed)
I have discussed the procedure for the virtual visit with the patient who has given consent to proceed with assessment and treatment.   Kilyn Maragh S Lacey Wallman, CMA     

## 2019-03-22 ENCOUNTER — Other Ambulatory Visit (INDEPENDENT_AMBULATORY_CARE_PROVIDER_SITE_OTHER): Payer: BLUE CROSS/BLUE SHIELD

## 2019-03-22 DIAGNOSIS — E119 Type 2 diabetes mellitus without complications: Secondary | ICD-10-CM

## 2019-03-22 LAB — BASIC METABOLIC PANEL
BUN: 18 mg/dL (ref 6–23)
CO2: 23 mEq/L (ref 19–32)
Calcium: 9.3 mg/dL (ref 8.4–10.5)
Chloride: 100 mEq/L (ref 96–112)
Creatinine, Ser: 0.9 mg/dL (ref 0.40–1.20)
GFR: 64.37 mL/min (ref >60.00)
Glucose, Bld: 277 mg/dL — ABNORMAL HIGH (ref 70–99)
Potassium: 4.7 mEq/L (ref 3.5–5.1)
Sodium: 133 mEq/L — ABNORMAL LOW (ref 135–145)

## 2019-03-22 LAB — HEMOGLOBIN A1C: Hgb A1c MFr Bld: 10.3 % — ABNORMAL HIGH (ref 4.6–6.5)

## 2019-03-23 ENCOUNTER — Encounter: Payer: Self-pay | Admitting: Family Medicine

## 2019-03-23 MED ORDER — EMPAGLIFLOZIN 10 MG PO TABS: 10.0000 mg | ORAL_TABLET | Freq: Every day | ORAL | 3 refills | Status: DC

## 2019-03-23 MED ORDER — DULAGLUTIDE 1.5 MG/0.5ML ~~LOC~~ SOAJ: 1.5000 mg | SUBCUTANEOUS | 3 refills | Status: DC

## 2019-03-29 ENCOUNTER — Other Ambulatory Visit: Payer: Self-pay | Admitting: Family Medicine

## 2019-04-22 ENCOUNTER — Other Ambulatory Visit: Payer: Self-pay | Admitting: Family Medicine

## 2019-05-02 DIAGNOSIS — G5622 Lesion of ulnar nerve, left upper limb: Secondary | ICD-10-CM | POA: Diagnosis not present

## 2019-05-02 DIAGNOSIS — M79642 Pain in left hand: Secondary | ICD-10-CM | POA: Diagnosis not present

## 2019-05-16 DIAGNOSIS — G5622 Lesion of ulnar nerve, left upper limb: Secondary | ICD-10-CM | POA: Diagnosis not present

## 2019-05-16 DIAGNOSIS — G5602 Carpal tunnel syndrome, left upper limb: Secondary | ICD-10-CM | POA: Diagnosis not present

## 2019-05-19 ENCOUNTER — Other Ambulatory Visit: Payer: Self-pay | Admitting: Family Medicine

## 2019-06-13 DIAGNOSIS — M79642 Pain in left hand: Secondary | ICD-10-CM | POA: Diagnosis not present

## 2019-06-13 DIAGNOSIS — G5622 Lesion of ulnar nerve, left upper limb: Secondary | ICD-10-CM | POA: Diagnosis not present

## 2019-06-13 DIAGNOSIS — E113291 Type 2 diabetes mellitus with mild nonproliferative diabetic retinopathy without macular edema, right eye: Secondary | ICD-10-CM | POA: Diagnosis not present

## 2019-06-13 DIAGNOSIS — G5602 Carpal tunnel syndrome, left upper limb: Secondary | ICD-10-CM | POA: Diagnosis not present

## 2019-06-13 DIAGNOSIS — H2513 Age-related nuclear cataract, bilateral: Secondary | ICD-10-CM | POA: Diagnosis not present

## 2019-06-13 DIAGNOSIS — H35033 Hypertensive retinopathy, bilateral: Secondary | ICD-10-CM | POA: Diagnosis not present

## 2019-06-13 LAB — HM DIABETES EYE EXAM

## 2019-06-22 ENCOUNTER — Encounter: Payer: Self-pay | Admitting: General Practice

## 2019-06-24 DIAGNOSIS — G4733 Obstructive sleep apnea (adult) (pediatric): Secondary | ICD-10-CM | POA: Diagnosis not present

## 2019-07-11 ENCOUNTER — Ambulatory Visit: Payer: BLUE CROSS/BLUE SHIELD | Admitting: Family Medicine

## 2019-07-12 DIAGNOSIS — G5622 Lesion of ulnar nerve, left upper limb: Secondary | ICD-10-CM | POA: Diagnosis not present

## 2019-07-12 DIAGNOSIS — G5602 Carpal tunnel syndrome, left upper limb: Secondary | ICD-10-CM | POA: Diagnosis not present

## 2019-07-18 ENCOUNTER — Telehealth: Payer: Self-pay | Admitting: Family Medicine

## 2019-07-18 MED ORDER — VALACYCLOVIR HCL 500 MG PO TABS: 500.0000 mg | ORAL_TABLET | Freq: Two times a day (BID) | ORAL | 2 refills | Status: AC

## 2019-07-18 NOTE — Telephone Encounter (Signed)
Please verify with pt that she does not have any lesions in her eye b/c that would require an eye doctor visit. [facility]ikes for refill on Valtrex

## 2019-07-18 NOTE — Telephone Encounter (Signed)
Spoke with patient, outbreak located near left eye

## 2019-07-18 NOTE — Telephone Encounter (Signed)
Medication Refill - Medication: valACYclovir (VALTREX) 500 MG tablet   Has the patient contacted their pharmacy? No. (Agent: If no, request that the patient contact the pharmacy for the refill.) (Agent: If yes, when and what did the pharmacy advise?)  Preferred Pharmacy (with phone number or street name):  [id number] [address], [place] [phone] (Phone) [phone] (Fax)     Agent: Please be advised that RX refills may take up to 3 business days. We ask that you follow-up with your pharmacy.

## 2019-07-18 NOTE — Telephone Encounter (Signed)
Called pt and verified there are no lesions in her eye. Pt will contact eye doctor if there are any changes.

## 2019-07-20 ENCOUNTER — Other Ambulatory Visit: Payer: Self-pay | Admitting: Family Medicine

## 2019-07-27 DIAGNOSIS — M79602 Pain in left arm: Secondary | ICD-10-CM | POA: Insufficient documentation

## 2019-07-29 DIAGNOSIS — M79602 Pain in left arm: Secondary | ICD-10-CM | POA: Diagnosis not present

## 2019-08-10 DIAGNOSIS — M79602 Pain in left arm: Secondary | ICD-10-CM | POA: Diagnosis not present

## 2019-08-19 ENCOUNTER — Other Ambulatory Visit: Payer: Self-pay | Admitting: Family Medicine

## 2019-08-24 DIAGNOSIS — M79602 Pain in left arm: Secondary | ICD-10-CM | POA: Diagnosis not present

## 2019-08-27 ENCOUNTER — Other Ambulatory Visit: Payer: Self-pay | Admitting: Family Medicine

## 2019-11-30 ENCOUNTER — Other Ambulatory Visit: Payer: Self-pay | Admitting: Family Medicine

## 2019-12-01 ENCOUNTER — Other Ambulatory Visit: Payer: Self-pay | Admitting: Family Medicine

## 2019-12-08 ENCOUNTER — Other Ambulatory Visit: Payer: Self-pay | Admitting: Family Medicine

## 2020-01-13 ENCOUNTER — Other Ambulatory Visit: Payer: Self-pay | Admitting: Family Medicine

## 2020-03-16 ENCOUNTER — Telehealth: Payer: BLUE CROSS/BLUE SHIELD

## 2020-05-03 ENCOUNTER — Other Ambulatory Visit: Payer: Self-pay | Admitting: Family Medicine

## 2020-06-18 ENCOUNTER — Other Ambulatory Visit: Payer: Self-pay | Admitting: Family Medicine

## 2020-09-21 ENCOUNTER — Other Ambulatory Visit: Payer: Self-pay | Admitting: Family Medicine

## 2020-11-25 ENCOUNTER — Other Ambulatory Visit: Payer: Self-pay | Admitting: Family Medicine

## 2020-11-26 ENCOUNTER — Other Ambulatory Visit: Payer: Self-pay | Admitting: Family Medicine

## 2020-12-22 ENCOUNTER — Other Ambulatory Visit: Payer: Self-pay | Admitting: Family Medicine

## 2021-01-11 ENCOUNTER — Other Ambulatory Visit: Payer: Self-pay

## 2021-01-11 ENCOUNTER — Encounter: Payer: Self-pay | Admitting: Family Medicine

## 2021-01-11 ENCOUNTER — Ambulatory Visit: Payer: 59 | Admitting: Family Medicine

## 2021-01-11 VITALS — BP 128/70 | HR 91 | Temp 97.8°F | Resp 19 | Ht 68.0 in | Wt 262.8 lb

## 2021-01-11 DIAGNOSIS — E119 Type 2 diabetes mellitus without complications: Secondary | ICD-10-CM

## 2021-01-11 DIAGNOSIS — Z23 Encounter for immunization: Secondary | ICD-10-CM | POA: Diagnosis not present

## 2021-01-11 DIAGNOSIS — I10 Essential (primary) hypertension: Secondary | ICD-10-CM

## 2021-01-11 DIAGNOSIS — F339 Major depressive disorder, recurrent, unspecified: Secondary | ICD-10-CM

## 2021-01-11 NOTE — Patient Instructions (Signed)
Follow up in 3-4 months to recheck diabetes and weight loss progress We'll notify you of your lab results and make any changes if needed Continue to work on healthy diet and regular exercise!  I'm so proud of you!!! Call and schedule your GYN and eye exams Call with any questions or concerns Stay Safe!  Stay Healthy!

## 2021-01-11 NOTE — Assessment & Plan Note (Signed)
Pt is down 12 lbs since last visit and 28 lbs overall!  Applauded her efforts at low carb diet and introduction of exercise.  Will continue to follow.

## 2021-01-11 NOTE — Assessment & Plan Note (Signed)
Chronic problem.  Well controlled today on Losartan 100mg  daily.  Check labs.  No anticipated med changes.

## 2021-01-11 NOTE — Assessment & Plan Note (Signed)
Chronic problem.  Has just been taking her Jardiance 10mg  daily as she ran out of Trulicity.  Home CBGs are ~150.  Foot exam done today.  On ARB.  Encouraged to schedule eye exam.  Applauded her recent weight loss and diet overhaul.  Check labs.  Adjust meds prn

## 2021-01-11 NOTE — Progress Notes (Signed)
   Subjective:    Patient ID: Vanessa Crawford, female    DOB: 10-17-1961, 60 y.o.   MRN: [id number]  HPI HTN- chronic problem, on Losartan 100mg .  Denies CP, SOB, HAs, visual changes, edema.  Hyperlipidemia- chronic problem, on Lipitor 10mg  daily, fenofibrate 160mg  daily.  Denies abd pain, N/V.  DM- chronic problem, Jardiance 10mg  daily, Trulicity 1.5mg /week.  Has been out of Trulicity 'for awhile'.  Home CBGs averaging ~150.  Due for foot exam, eye exam.  Denies symptomatic lows, no numbness/tingling  Morbid obesity- pt has lost 12 lbs since last visit and 28 lbs overall.  Pt has changed her diet and has cut out most carbs and sweets.  Depression- pt lost her father in August and since then has completely re-evaluated things.  She has decided to take her health seriously, stop emotionally eating.  On Prozac 40mg  daily  Review of Systems For ROS see HPI   This visit occurred during the SARS-CoV-2 public health emergency.  Safety protocols were in place, including screening questions prior to the visit, additional usage of staff PPE, and extensive cleaning of exam room while observing appropriate contact time as indicated for disinfecting solutions.       Objective:   Physical Exam Vitals reviewed.  Constitutional:      General: She is not in acute distress.    Appearance: She is well-developed and well-nourished. She is obese.  HENT:     Head: Normocephalic and atraumatic.  Eyes:     Extraocular Movements: EOM normal.     Conjunctiva/sclera: Conjunctivae normal.     Pupils: Pupils are equal, round, and reactive to light.  Neck:     Thyroid: No thyromegaly.  Cardiovascular:     Rate and Rhythm: Normal rate and regular rhythm.     Pulses: Normal pulses and intact distal pulses.     Heart sounds: Normal heart sounds. No murmur heard.   Pulmonary:     Effort: Pulmonary effort is normal. No respiratory distress.     Breath sounds: Normal breath sounds.  Abdominal:      General: There is no distension.     Palpations: Abdomen is soft.     Tenderness: There is no abdominal tenderness.  Musculoskeletal:        General: No edema.     Cervical back: Normal range of motion and neck supple.     Right lower leg: No edema.     Left lower leg: No edema.  Lymphadenopathy:     Cervical: No cervical adenopathy.  Skin:    General: Skin is warm and dry.  Neurological:     Mental Status: She is alert and oriented to person, place, and time.  Psychiatric:        Mood and Affect: Mood and affect normal.        Behavior: Behavior normal.           Assessment & Plan:

## 2021-01-11 NOTE — Assessment & Plan Note (Signed)
Chronic problem, on Prozac 40mg  daily.  Doing very well right now.  Pleased to see the change.

## 2021-01-12 LAB — HEPATIC FUNCTION PANEL
AG Ratio: 1.7 (calc) (ref 1.0–2.5)
ALT: 25 U/L (ref 6–29)
AST: 15 U/L (ref 10–35)
Albumin: 4.3 g/dL (ref 3.6–5.1)
Alkaline phosphatase (APISO): 68 U/L (ref 37–153)
Bilirubin, Direct: 0.1 mg/dL (ref 0.0–0.2)
Globulin: 2.5 g/dL (calc) (ref 1.9–3.7)
Indirect Bilirubin: 0.6 mg/dL (calc) (ref 0.2–1.2)
Total Bilirubin: 0.7 mg/dL (ref 0.2–1.2)
Total Protein: 6.8 g/dL (ref 6.1–8.1)

## 2021-01-12 LAB — CBC WITH DIFFERENTIAL/PLATELET
Absolute Monocytes: 371 cells/uL (ref 200–950)
Basophils Absolute: 38 cells/uL (ref 0–200)
Basophils Relative: 0.6 %
Eosinophils Absolute: 282 cells/uL (ref 15–500)
Eosinophils Relative: 4.4 %
HCT: 45.1 % — ABNORMAL HIGH (ref 35.0–45.0)
Hemoglobin: 15.3 g/dL (ref 11.7–15.5)
Lymphs Abs: 2221 cells/uL (ref 850–3900)
MCH: 28.5 pg (ref 27.0–33.0)
MCHC: 33.9 g/dL (ref 32.0–36.0)
MCV: 84.1 fL (ref 80.0–100.0)
MPV: 11 fL (ref 7.5–12.5)
Monocytes Relative: 5.8 %
Neutro Abs: 3488 cells/uL (ref 1500–7800)
Neutrophils Relative %: 54.5 %
Platelets: 234 10*3/uL (ref 140–400)
RBC: 5.36 10*6/uL — ABNORMAL HIGH (ref 3.80–5.10)
RDW: 14.2 % (ref 11.0–15.0)
Total Lymphocyte: 34.7 %
WBC: 6.4 10*3/uL (ref 3.8–10.8)

## 2021-01-12 LAB — BASIC METABOLIC PANEL
BUN: 19 mg/dL (ref 7–25)
CO2: 21 mmol/L (ref 20–32)
Calcium: 9.4 mg/dL (ref 8.6–10.4)
Chloride: 104 mmol/L (ref 98–110)
Creat: 0.78 mg/dL (ref 0.50–1.05)
Glucose, Bld: 116 mg/dL — ABNORMAL HIGH (ref 65–99)
Potassium: 4.1 mmol/L (ref 3.5–5.3)
Sodium: 137 mmol/L (ref 135–146)

## 2021-01-12 LAB — HEMOGLOBIN A1C
Hgb A1c MFr Bld: 9.1 % of total Hgb — ABNORMAL HIGH (ref <5.7)
Mean Plasma Glucose: 214 mg/dL
eAG (mmol/L): 11.9 mmol/L

## 2021-01-12 LAB — TSH: TSH: 1.1 mIU/L (ref 0.40–4.50)

## 2021-01-12 LAB — LIPID PANEL
Cholesterol: 151 mg/dL (ref <200)
HDL: 27 mg/dL — ABNORMAL LOW (ref >=50)
LDL Cholesterol (Calc): 96 mg/dL (calc)
Non-HDL Cholesterol (Calc): 124 mg/dL (calc) (ref <130)
Total CHOL/HDL Ratio: 5.6 (calc) — ABNORMAL HIGH (ref <5.0)
Triglycerides: 179 mg/dL — ABNORMAL HIGH (ref <150)

## 2021-01-14 ENCOUNTER — Other Ambulatory Visit: Payer: Self-pay

## 2021-01-14 DIAGNOSIS — E119 Type 2 diabetes mellitus without complications: Secondary | ICD-10-CM

## 2021-01-14 MED ORDER — TRULICITY 1.5 MG/0.5ML ~~LOC~~ SOAJ: SUBCUTANEOUS | 1 refills | Status: DC

## 2021-02-16 ENCOUNTER — Other Ambulatory Visit: Payer: Self-pay | Admitting: Family Medicine

## 2021-02-23 ENCOUNTER — Other Ambulatory Visit: Payer: Self-pay | Admitting: Family Medicine

## 2021-03-12 ENCOUNTER — Other Ambulatory Visit: Payer: Self-pay | Admitting: Family Medicine

## 2021-03-12 DIAGNOSIS — E119 Type 2 diabetes mellitus without complications: Secondary | ICD-10-CM

## 2021-05-13 ENCOUNTER — Other Ambulatory Visit: Payer: Self-pay | Admitting: Family Medicine

## 2021-05-13 DIAGNOSIS — E119 Type 2 diabetes mellitus without complications: Secondary | ICD-10-CM

## 2021-06-12 ENCOUNTER — Encounter: Payer: Self-pay | Admitting: *Deleted

## 2021-08-21 ENCOUNTER — Other Ambulatory Visit: Payer: Self-pay | Admitting: Family Medicine

## 2021-09-10 ENCOUNTER — Other Ambulatory Visit: Payer: Self-pay | Admitting: Family Medicine

## 2021-09-10 DIAGNOSIS — E119 Type 2 diabetes mellitus without complications: Secondary | ICD-10-CM

## 2021-10-02 ENCOUNTER — Other Ambulatory Visit: Payer: Self-pay | Admitting: Family Medicine

## 2021-10-11 ENCOUNTER — Other Ambulatory Visit: Payer: Self-pay | Admitting: Family Medicine

## 2021-10-11 DIAGNOSIS — E119 Type 2 diabetes mellitus without complications: Secondary | ICD-10-CM

## 2021-11-16 ENCOUNTER — Other Ambulatory Visit: Payer: Self-pay | Admitting: Family Medicine

## 2021-12-12 ENCOUNTER — Other Ambulatory Visit: Payer: Self-pay | Admitting: Family Medicine

## 2021-12-12 DIAGNOSIS — E119 Type 2 diabetes mellitus without complications: Secondary | ICD-10-CM

## 2022-02-12 ENCOUNTER — Other Ambulatory Visit: Payer: Self-pay | Admitting: Family Medicine

## 2022-02-13 ENCOUNTER — Other Ambulatory Visit: Payer: Self-pay | Admitting: Family Medicine

## 2022-02-13 DIAGNOSIS — E119 Type 2 diabetes mellitus without complications: Secondary | ICD-10-CM

## 2022-04-18 ENCOUNTER — Encounter: Payer: Self-pay | Admitting: Family Medicine

## 2022-04-18 ENCOUNTER — Ambulatory Visit (INDEPENDENT_AMBULATORY_CARE_PROVIDER_SITE_OTHER): Payer: No Typology Code available for payment source | Admitting: Family Medicine

## 2022-04-18 VITALS — BP 130/88 | HR 100 | Temp 98.0°F | Resp 17 | Ht 68.0 in | Wt 275.2 lb

## 2022-04-18 DIAGNOSIS — Z0000 Encounter for general adult medical examination without abnormal findings: Secondary | ICD-10-CM

## 2022-04-18 DIAGNOSIS — E119 Type 2 diabetes mellitus without complications: Secondary | ICD-10-CM | POA: Diagnosis not present

## 2022-04-18 DIAGNOSIS — I10 Essential (primary) hypertension: Secondary | ICD-10-CM

## 2022-04-18 DIAGNOSIS — F339 Major depressive disorder, recurrent, unspecified: Secondary | ICD-10-CM | POA: Diagnosis not present

## 2022-04-18 DIAGNOSIS — E559 Vitamin D deficiency, unspecified: Secondary | ICD-10-CM | POA: Diagnosis not present

## 2022-04-18 MED ORDER — TRULICITY 1.5 MG/0.5ML ~~LOC~~ SOAJ: 1.5000 mg | SUBCUTANEOUS | 1 refills | Status: DC

## 2022-04-18 MED ORDER — FLUOXETINE HCL 40 MG PO CAPS: ORAL_CAPSULE | ORAL | 1 refills | Status: DC

## 2022-04-18 MED ORDER — BUPROPION HCL ER (XL) 150 MG PO TB24: 150.0000 mg | ORAL_TABLET | Freq: Every day | ORAL | 3 refills | Status: DC

## 2022-04-18 MED ORDER — LOSARTAN POTASSIUM 100 MG PO TABS: 100.0000 mg | ORAL_TABLET | Freq: Every day | ORAL | 1 refills | Status: DC

## 2022-04-18 NOTE — Assessment & Plan Note (Signed)
Chronic problem.  Adequate control.  Asymptomatic.  Refill on Losartan provided.  Check labs due to ARB but no anticipated med changes. ?

## 2022-04-18 NOTE — Assessment & Plan Note (Signed)
Check labs and replete prn. 

## 2022-04-18 NOTE — Assessment & Plan Note (Signed)
Deteriorated.  Pt admits she has not been taking the Trulicity.  She has been taking the Jardiance.  UTD on eye exam.  Foot exam done today.  Pt is on ARB but will get microalbumin.  Reviewed need for low carb diet, regular exercise, and medication compliance.  Check labs.  Adjust meds prn  ?

## 2022-04-18 NOTE — Progress Notes (Signed)
? ?  Subjective:  ? ? Patient ID: Vanessa Crawford, female    DOB: 06-13-1961, 61 y.o.   MRN: [id number] ? ?HPI ?CPE- UTD on eye exam, foot exam, Tdap.  Due for mammo- scheduled July 10th ? ?Patient Care Team  ?  Relationship Specialty Notifications Start End  ?Midge Minium, MD PCP - General   11/27/10   ?Marylynn Pearson, MD Consulting Physician Obstetrics and Gynecology  05/01/17   ?  ?Health Maintenance  ?Topic Date Due  ? HEMOGLOBIN A1C  07/11/2021  ? INFLUENZA VACCINE  07/15/2022  ? OPHTHALMOLOGY EXAM  04/15/2023  ? FOOT EXAM  04/19/2023  ? TETANUS/TDAP  11/01/2025  ? Hepatitis C Screening  Completed  ? HPV VACCINES  Aged Out  ? MAMMOGRAM  Discontinued  ? PAP SMEAR-Modifier  Discontinued  ? COLONOSCOPY (Pts 45-81yrs Insurance coverage will need to be confirmed)  Discontinued  ? COVID-19 Vaccine  Discontinued  ? HIV Screening  Discontinued  ? Zoster Vaccines- Shingrix  Discontinued  ?  ?DM- has not been taking Trulicity but has been taking Jardiance.  Admits to eating very poorly.  No regular exercise. ? ?Review of Systems ?Patient reports no vision/ hearing changes, adenopathy,fever, persistant/recurrent hoarseness , swallowing issues, chest pain, palpitations, edema, persistant/recurrent cough, hemoptysis, dyspnea (rest/exertional/paroxysmal nocturnal), gastrointestinal bleeding (melena, rectal bleeding), abdominal pain, significant heartburn, bowel changes, GU symptoms (dysuria, hematuria, incontinence), Gyn symptoms (abnormal  bleeding, pain),  syncope, focal weakness, memory loss, numbness & tingling, skin/hair/nail changes, abnormal bruising or bleeding.  ? ?+ 12 lb weight gain- father passed away and she is now dealing w/ mom which is very stressful.  Pt is a stress eater. ?+ anxiety/depression- pt is overwhelmed w/ care of mother and loss of father. ?   ?Objective:  ? Physical Exam ?General Appearance:    Alert, cooperative, no distress, appears stated age, obese  ?Head:    Normocephalic, without  obvious abnormality, atraumatic  ?Eyes:    PERRL, conjunctiva/corneas clear, EOM's intact both eyes  ?Ears:    Normal TM's and external ear canals, both ears  ?Nose:   Nares normal, septum midline, mucosa normal, no drainage  ?  or sinus tenderness  ?Throat:   Lips, mucosa, and tongue normal; teeth and gums normal  ?Neck:   Supple, symmetrical, trachea midline, no adenopathy;  ?  Thyroid: no enlargement/tenderness/nodules  ?Back:     Symmetric, no curvature, ROM normal, no CVA tenderness  ?Lungs:     Clear to auscultation bilaterally, respirations unlabored  ?Chest Wall:    No tenderness or deformity  ? Heart:    Regular rate and rhythm, S1 and S2 normal, no murmur, rub ?  or gallop  ?Breast Exam:    Deferred to GYN  ?Abdomen:     Soft, non-tender, bowel sounds active all four quadrants,  ?  no masses, no organomegaly  ?Genitalia:    Deferred to GYN  ?Rectal:    ?Extremities:   Extremities normal, atraumatic, no cyanosis or edema  ?Pulses:   2+ and symmetric all extremities  ?Skin:   Skin color, texture, turgor normal, no rashes or lesions  ?Lymph nodes:   Cervical, supraclavicular, and axillary nodes normal  ?Neurologic:   CNII-XII intact, normal strength, sensation and reflexes  ?  throughout  ?  ? ? ? ?   ?Assessment & Plan:  ? ? ?

## 2022-04-18 NOTE — Assessment & Plan Note (Signed)
Deteriorated.  Pt has gained 12 lbs since father passed away.  She is not dealing w/ her mother which is very stressful.  The added stress has worsened her emotional eating.  Reviewed need for low carb diet and regular physical activity.  Will follow. ?

## 2022-04-18 NOTE — Assessment & Plan Note (Signed)
Deteriorated.  Pt is not doing well at this time w/ the increased stress of caring for her mother.  The added stress has caused her to increase her emotional eating which has caused a 12 lb weight gain.  No regular exercise.  Already on Fluoxetine daily.  Will add Wellbutrin 150mg  daily to improve mood and hopefully help w/ weight loss.  Pt expressed understanding and is in agreement w/ plan.  ?

## 2022-04-18 NOTE — Assessment & Plan Note (Signed)
Pt's PE WNL w/ exception of obesity.  Has GYN exam scheduled for both pap and mammo.  Due for colon cancer screening but has declined at this time.  UTD on Tdap.  Check labs.  Anticipatory guidance provided.  ?

## 2022-04-18 NOTE — Patient Instructions (Addendum)
Follow up in 6 weeks to recheck mood ?We'll notify you of your lab results and make any changes if needed ?START the Wellbutrin once daily (in addition to Fluoxetine) ?Continue to work on [employer] and regular physical activity ?Try and avoid the emotional eating- ask yourself do I need this?  Is this the best choice right now? ?Call with any questions or concerns ?Stay Safe!  Stay Healthy! ?Hang in there!!! ?

## 2022-04-19 LAB — VITAMIN D 25 HYDROXY (VIT D DEFICIENCY, FRACTURES): Vit D, 25-Hydroxy: 56 ng/mL (ref 30–100)

## 2022-04-19 LAB — BASIC METABOLIC PANEL
BUN: 15 mg/dL (ref 7–25)
CO2: 24 mmol/L (ref 20–32)
Calcium: 9.4 mg/dL (ref 8.6–10.4)
Chloride: 100 mmol/L (ref 98–110)
Creat: 0.9 mg/dL (ref 0.50–1.05)
Glucose, Bld: 386 mg/dL — ABNORMAL HIGH (ref 65–99)
Potassium: 4.4 mmol/L (ref 3.5–5.3)
Sodium: 134 mmol/L — ABNORMAL LOW (ref 135–146)

## 2022-04-19 LAB — HEPATIC FUNCTION PANEL
AG Ratio: 1.5 (calc) (ref 1.0–2.5)
ALT: 37 U/L — ABNORMAL HIGH (ref 6–29)
AST: 15 U/L (ref 10–35)
Albumin: 4.2 g/dL (ref 3.6–5.1)
Alkaline phosphatase (APISO): 98 U/L (ref 37–153)
Bilirubin, Direct: 0.1 mg/dL (ref 0.0–0.2)
Globulin: 2.8 g/dL (calc) (ref 1.9–3.7)
Indirect Bilirubin: 0.5 mg/dL (calc) (ref 0.2–1.2)
Total Bilirubin: 0.6 mg/dL (ref 0.2–1.2)
Total Protein: 7 g/dL (ref 6.1–8.1)

## 2022-04-19 LAB — CBC WITH DIFFERENTIAL/PLATELET
Absolute Monocytes: 511 cells/uL (ref 200–950)
Basophils Absolute: 52 cells/uL (ref 0–200)
Basophils Relative: 0.7 %
Eosinophils Absolute: 207 cells/uL (ref 15–500)
Eosinophils Relative: 2.8 %
HCT: 46.8 % — ABNORMAL HIGH (ref 35.0–45.0)
Hemoglobin: 15.5 g/dL (ref 11.7–15.5)
Lymphs Abs: 2242 cells/uL (ref 850–3900)
MCH: 28.6 pg (ref 27.0–33.0)
MCHC: 33.1 g/dL (ref 32.0–36.0)
MCV: 86.3 fL (ref 80.0–100.0)
MPV: 11.2 fL (ref 7.5–12.5)
Monocytes Relative: 6.9 %
Neutro Abs: 4388 cells/uL (ref 1500–7800)
Neutrophils Relative %: 59.3 %
Platelets: 216 10*3/uL (ref 140–400)
RBC: 5.42 10*6/uL — ABNORMAL HIGH (ref 3.80–5.10)
RDW: 14.1 % (ref 11.0–15.0)
Total Lymphocyte: 30.3 %
WBC: 7.4 10*3/uL (ref 3.8–10.8)

## 2022-04-19 LAB — LIPID PANEL
Cholesterol: 163 mg/dL (ref <200)
HDL: 28 mg/dL — ABNORMAL LOW (ref >=50)
Non-HDL Cholesterol (Calc): 135 mg/dL (calc) — ABNORMAL HIGH (ref <130)
Total CHOL/HDL Ratio: 5.8 (calc) — ABNORMAL HIGH (ref <5.0)
Triglycerides: 457 mg/dL — ABNORMAL HIGH (ref <150)

## 2022-04-19 LAB — MICROALBUMIN / CREATININE URINE RATIO
Creatinine, Urine: 40 mg/dL (ref 20–275)
Microalb Creat Ratio: 5 mcg/mg creat (ref <30)
Microalb, Ur: 0.2 mg/dL

## 2022-04-19 LAB — HEMOGLOBIN A1C
Hgb A1c MFr Bld: 12 % of total Hgb — ABNORMAL HIGH (ref <5.7)
Mean Plasma Glucose: 298 mg/dL
eAG (mmol/L): 16.5 mmol/L

## 2022-04-19 LAB — TSH: TSH: 1.92 mIU/L (ref 0.40–4.50)

## 2022-04-21 ENCOUNTER — Telehealth: Payer: Self-pay

## 2022-04-21 NOTE — Telephone Encounter (Signed)
-----   Message from Sheliah Hatch, MD sent at 04/20/2022 11:47 AM EDT ----- ?Your A1C is VERY high at 12%  This puts you at much higher risk for complications such as heart attack, stroke, visual changes, neuropathy, kidney issues.  This will improve w/ regular use of Trulicity, Jardiance, and a low carb diet w/ regular physical activity.   ? ?Your triglycerides (fatty part of blood) are also sky high.  This goes hand in hand w/ the really high A1C and will improve w/ low carb diet and regular exercise.  Continue the Fenofibrate and Lipitor daily ? ?Remainder of labs are stable ?

## 2022-04-21 NOTE — Telephone Encounter (Signed)
Spoke w/ pt and advised of lab results. Pt expressed verbal understanding  ?

## 2022-05-18 ENCOUNTER — Other Ambulatory Visit: Payer: Self-pay | Admitting: Family Medicine

## 2022-05-26 ENCOUNTER — Other Ambulatory Visit: Payer: Self-pay | Admitting: Family Medicine

## 2022-06-21 ENCOUNTER — Other Ambulatory Visit: Payer: Self-pay | Admitting: Family Medicine

## 2022-06-21 DIAGNOSIS — E119 Type 2 diabetes mellitus without complications: Secondary | ICD-10-CM

## 2022-06-23 ENCOUNTER — Other Ambulatory Visit: Payer: Self-pay | Admitting: Family Medicine

## 2022-06-23 DIAGNOSIS — E119 Type 2 diabetes mellitus without complications: Secondary | ICD-10-CM

## 2022-06-23 DIAGNOSIS — I10 Essential (primary) hypertension: Secondary | ICD-10-CM | POA: Insufficient documentation

## 2022-06-23 LAB — HM PAP SMEAR

## 2022-06-25 ENCOUNTER — Other Ambulatory Visit: Payer: Self-pay | Admitting: Obstetrics and Gynecology

## 2022-06-25 DIAGNOSIS — R928 Other abnormal and inconclusive findings on diagnostic imaging of breast: Secondary | ICD-10-CM

## 2022-06-26 ENCOUNTER — Encounter: Payer: Self-pay | Admitting: Family Medicine

## 2022-07-02 ENCOUNTER — Other Ambulatory Visit: Payer: Self-pay | Admitting: Obstetrics and Gynecology

## 2022-07-02 ENCOUNTER — Ambulatory Visit
Admission: RE | Admit: 2022-07-02 | Discharge: 2022-07-02 | Disposition: A | Discharge status: Home / Self Care | Payer: No Typology Code available for payment source | Source: Ambulatory Visit | Attending: Obstetrics and Gynecology | Admitting: Obstetrics and Gynecology

## 2022-07-02 DIAGNOSIS — R928 Other abnormal and inconclusive findings on diagnostic imaging of breast: Secondary | ICD-10-CM

## 2022-07-02 DIAGNOSIS — R921 Mammographic calcification found on diagnostic imaging of breast: Secondary | ICD-10-CM

## 2022-07-07 ENCOUNTER — Encounter: Payer: Self-pay | Admitting: Family Medicine

## 2022-07-07 ENCOUNTER — Ambulatory Visit (HOSPITAL_COMMUNITY)
Admission: RE | Admit: 2022-07-07 | Discharge: 2022-07-07 | Disposition: A | Discharge status: Home / Self Care | Payer: No Typology Code available for payment source | Source: Ambulatory Visit | Attending: Cardiology | Admitting: Cardiology

## 2022-07-07 ENCOUNTER — Other Ambulatory Visit (HOSPITAL_COMMUNITY): Payer: Self-pay | Admitting: Family Medicine

## 2022-07-07 DIAGNOSIS — M79604 Pain in right leg: Secondary | ICD-10-CM

## 2022-07-17 DIAGNOSIS — M75120 Complete rotator cuff tear or rupture of unspecified shoulder, not specified as traumatic: Secondary | ICD-10-CM | POA: Insufficient documentation

## 2022-08-21 ENCOUNTER — Other Ambulatory Visit: Payer: Self-pay | Admitting: Family Medicine

## 2022-08-21 DIAGNOSIS — E119 Type 2 diabetes mellitus without complications: Secondary | ICD-10-CM

## 2022-08-23 ENCOUNTER — Other Ambulatory Visit: Payer: Self-pay | Admitting: Family Medicine

## 2022-10-20 ENCOUNTER — Other Ambulatory Visit: Payer: Self-pay | Admitting: Family Medicine

## 2022-10-20 DIAGNOSIS — E119 Type 2 diabetes mellitus without complications: Secondary | ICD-10-CM

## 2022-10-22 ENCOUNTER — Other Ambulatory Visit: Payer: Self-pay | Admitting: Family Medicine

## 2022-10-22 DIAGNOSIS — F339 Major depressive disorder, recurrent, unspecified: Secondary | ICD-10-CM

## 2022-10-22 DIAGNOSIS — I10 Essential (primary) hypertension: Secondary | ICD-10-CM

## 2022-11-23 ENCOUNTER — Other Ambulatory Visit: Payer: Self-pay | Admitting: Family Medicine

## 2023-02-24 ENCOUNTER — Other Ambulatory Visit: Payer: Self-pay | Admitting: Family Medicine

## 2023-03-10 ENCOUNTER — Other Ambulatory Visit: Payer: Self-pay | Admitting: Family Medicine

## 2023-03-10 DIAGNOSIS — E119 Type 2 diabetes mellitus without complications: Secondary | ICD-10-CM

## 2023-04-06 ENCOUNTER — Other Ambulatory Visit: Payer: Self-pay | Admitting: Family Medicine

## 2023-05-09 ENCOUNTER — Other Ambulatory Visit: Payer: Self-pay | Admitting: Family Medicine

## 2023-05-12 ENCOUNTER — Other Ambulatory Visit: Payer: Self-pay

## 2023-05-12 MED ORDER — FEXOFENADINE HCL 180 MG PO TABS: ORAL_TABLET | ORAL | 0 refills | Status: DC

## 2023-06-01 ENCOUNTER — Other Ambulatory Visit: Payer: Self-pay | Admitting: Family Medicine

## 2023-06-01 DIAGNOSIS — F339 Major depressive disorder, recurrent, unspecified: Secondary | ICD-10-CM

## 2023-06-01 DIAGNOSIS — I10 Essential (primary) hypertension: Secondary | ICD-10-CM

## 2023-06-30 ENCOUNTER — Other Ambulatory Visit: Payer: Self-pay | Admitting: Family Medicine

## 2023-06-30 DIAGNOSIS — I10 Essential (primary) hypertension: Secondary | ICD-10-CM

## 2023-07-07 ENCOUNTER — Other Ambulatory Visit: Payer: Self-pay | Admitting: Family Medicine

## 2023-07-07 ENCOUNTER — Other Ambulatory Visit: Payer: Self-pay

## 2023-07-07 MED ORDER — OMEPRAZOLE 20 MG PO CPDR: 20.0000 mg | DELAYED_RELEASE_CAPSULE | Freq: Every day | ORAL | 0 refills | Status: DC

## 2023-08-10 ENCOUNTER — Other Ambulatory Visit: Payer: Self-pay | Admitting: Family Medicine

## 2023-08-10 DIAGNOSIS — E119 Type 2 diabetes mellitus without complications: Secondary | ICD-10-CM

## 2023-10-07 ENCOUNTER — Telehealth: Payer: Self-pay | Admitting: Family Medicine

## 2023-10-07 ENCOUNTER — Other Ambulatory Visit: Payer: Self-pay

## 2023-10-07 NOTE — Telephone Encounter (Signed)
Encourage patient to contact the pharmacy for refills or they can request refills through [facility]  (Please schedule appointment if patient has not been seen in over a year)    WHAT PHARMACY WOULD THEY LIKE THIS SENT TO: [id number] - [place], [place] - [address]   MEDICATION NAME & DOSE: buPROPion (WELLBUTRIN XL) 150 MG 24 hr tablet  fexofenadine (ALLEGRA) 180 MG tablet   NOTES/COMMENTS FROM PATIENT: Pt has physical scheduled on 11/7 but it already out of these 2 medications      Front office please notify patient: It takes 48-72 hours to process rx refill requests Ask patient to call pharmacy to ensure rx is ready before heading there.

## 2023-10-07 NOTE — Telephone Encounter (Signed)
Last office visit 04/18/2022 Upcoming appt 10/22/2023 Please advise

## 2023-10-08 MED ORDER — BUPROPION HCL ER (XL) 150 MG PO TB24: 150.0000 mg | ORAL_TABLET | Freq: Every day | ORAL | 0 refills | Status: DC

## 2023-10-08 MED ORDER — FEXOFENADINE HCL 180 MG PO TABS: ORAL_TABLET | ORAL | 0 refills | Status: DC

## 2023-10-08 NOTE — Telephone Encounter (Signed)
Patient has been informed and was very grateful

## 2023-10-08 NOTE — Telephone Encounter (Signed)
Prescription was sent for 30 days of each

## 2023-10-22 ENCOUNTER — Ambulatory Visit: Payer: No Typology Code available for payment source | Admitting: Family Medicine

## 2023-10-22 ENCOUNTER — Encounter: Payer: Self-pay | Admitting: Family Medicine

## 2023-10-22 VITALS — BP 118/74 | HR 88 | Temp 98.1°F | Ht 68.0 in | Wt 266.1 lb

## 2023-10-22 DIAGNOSIS — Z1211 Encounter for screening for malignant neoplasm of colon: Secondary | ICD-10-CM

## 2023-10-22 DIAGNOSIS — Z23 Encounter for immunization: Secondary | ICD-10-CM

## 2023-10-22 DIAGNOSIS — I10 Essential (primary) hypertension: Secondary | ICD-10-CM

## 2023-10-22 DIAGNOSIS — Z7984 Long term (current) use of oral hypoglycemic drugs: Secondary | ICD-10-CM | POA: Diagnosis not present

## 2023-10-22 DIAGNOSIS — E781 Pure hyperglyceridemia: Secondary | ICD-10-CM | POA: Diagnosis not present

## 2023-10-22 DIAGNOSIS — Z7985 Long-term (current) use of injectable non-insulin antidiabetic drugs: Secondary | ICD-10-CM

## 2023-10-22 DIAGNOSIS — F339 Major depressive disorder, recurrent, unspecified: Secondary | ICD-10-CM

## 2023-10-22 DIAGNOSIS — E119 Type 2 diabetes mellitus without complications: Secondary | ICD-10-CM

## 2023-10-22 NOTE — Patient Instructions (Addendum)
Follow up in 3 months to recheck sugar and mood We'll notify you of your lab results and make any changes if needed Continue to work on healthy diet and regular exercise- you can do it!!! Schedule your eye exam and have them send me a copy of their report We'll call you to schedule your GI appt to discuss colonoscopy Call with any questions or concerns Stay Safe!  Stay Healthy! Hang in there!!!

## 2023-10-22 NOTE — Progress Notes (Signed)
   Subjective:    Patient ID: Vanessa Crawford, female    DOB: 1961/07/27, 62 y.o.   MRN: [id number]  HPI DM- chronic problem.  On Jardiance 10mg  daily, Trulicity 1.5mg  weekly.  Overdue for foot exam, urine micro, eye exam.  Denies symptomatic lows, no numbness/tingling of hands/feet.  Hyperlipidemia- chronic problem, on Lipitor 10mg  daily and Fenofibrate 160mg  daily.  Denies abd pain, N/V.  HTN- chronic problem, on Losartan 100mg  daily, well controlled.  Denies CP, SOB, HA's, visual changes, edema.  Depression- ongoing issue, on Fluoxetine 40mg  daily and Wellbutrin 150mg  daily.  Pt reports some increased tearfulness.  In the last 18 months has lost mom, lost her job, son has been in prison for the last year- pt is trying to take care of grandsons.  Had to put 2 pets to sleep.  Had rotator cuff surgery.   Review of Systems For ROS see HPI     Objective:   Physical Exam Vitals reviewed.  Constitutional:      General: She is not in acute distress.    Appearance: Normal appearance. She is well-developed. She is obese. She is not ill-appearing.  HENT:     Head: Normocephalic and atraumatic.  Eyes:     Conjunctiva/sclera: Conjunctivae normal.     Pupils: Pupils are equal, round, and reactive to light.  Neck:     Thyroid: No thyromegaly.  Cardiovascular:     Rate and Rhythm: Normal rate and regular rhythm.     Heart sounds: Normal heart sounds. No murmur heard. Pulmonary:     Effort: Pulmonary effort is normal. No respiratory distress.     Breath sounds: Normal breath sounds.  Abdominal:     General: There is no distension.     Palpations: Abdomen is soft.     Tenderness: There is no abdominal tenderness.  Musculoskeletal:     Cervical back: Normal range of motion and neck supple.     Right lower leg: No edema.     Left lower leg: No edema.  Lymphadenopathy:     Cervical: No cervical adenopathy.  Skin:    General: Skin is warm and dry.  Neurological:     General: No focal  deficit present.     Mental Status: She is alert and oriented to person, place, and time.  Psychiatric:        Mood and Affect: Mood normal.        Behavior: Behavior normal.        Thought Content: Thought content normal.          Assessment & Plan:

## 2023-10-23 LAB — CBC WITH DIFFERENTIAL/PLATELET
Basophils Absolute: 0.1 10*3/uL (ref 0.0–0.1)
Basophils Relative: 0.8 % (ref 0.0–3.0)
Eosinophils Absolute: 0.3 10*3/uL (ref 0.0–0.7)
Eosinophils Relative: 4.2 % (ref 0.0–5.0)
HCT: 47.6 % — ABNORMAL HIGH (ref 36.0–46.0)
Hemoglobin: 15.7 g/dL — ABNORMAL HIGH (ref 12.0–15.0)
Lymphocytes Relative: 32.7 % (ref 12.0–46.0)
Lymphs Abs: 2.4 10*3/uL (ref 0.7–4.0)
MCHC: 33 g/dL (ref 30.0–36.0)
MCV: 88.3 fL (ref 78.0–100.0)
Monocytes Absolute: 0.6 10*3/uL (ref 0.1–1.0)
Monocytes Relative: 8.3 % (ref 3.0–12.0)
Neutro Abs: 4 10*3/uL (ref 1.4–7.7)
Neutrophils Relative %: 54 % (ref 43.0–77.0)
Platelets: 226 10*3/uL (ref 150.0–400.0)
RBC: 5.38 Mil/uL — ABNORMAL HIGH (ref 3.87–5.11)
RDW: 14.5 % (ref 11.5–15.5)
WBC: 7.3 10*3/uL (ref 4.0–10.5)

## 2023-10-23 LAB — HEPATIC FUNCTION PANEL
ALT: 29 U/L (ref 0–35)
AST: 15 U/L (ref 0–37)
Albumin: 4.6 g/dL (ref 3.5–5.2)
Alkaline Phosphatase: 116 U/L (ref 39–117)
Bilirubin, Direct: 0.1 mg/dL (ref 0.0–0.3)
Total Bilirubin: 0.8 mg/dL (ref 0.2–1.2)
Total Protein: 7.9 g/dL (ref 6.0–8.3)

## 2023-10-23 LAB — BASIC METABOLIC PANEL
BUN: 12 mg/dL (ref 6–23)
CO2: 29 meq/L (ref 19–32)
Calcium: 9.8 mg/dL (ref 8.4–10.5)
Chloride: 102 meq/L (ref 96–112)
Creatinine, Ser: 0.91 mg/dL (ref 0.40–1.20)
GFR: 67.7 mL/min (ref >60.00)
Glucose, Bld: 216 mg/dL — ABNORMAL HIGH (ref 70–99)
Potassium: 4.1 meq/L (ref 3.5–5.1)
Sodium: 139 meq/L (ref 135–145)

## 2023-10-23 LAB — MICROALBUMIN / CREATININE URINE RATIO
Creatinine,U: 59.4 mg/dL
Microalb Creat Ratio: 2.2 mg/g (ref 0.0–30.0)
Microalb, Ur: 1.3 mg/dL (ref 0.0–1.9)

## 2023-10-23 LAB — LIPID PANEL
Cholesterol: 150 mg/dL (ref 0–200)
HDL: 32.8 mg/dL — ABNORMAL LOW (ref >39.00)
LDL Cholesterol: 62 mg/dL (ref 0–99)
NonHDL: 117.2
Total CHOL/HDL Ratio: 5
Triglycerides: 274 mg/dL — ABNORMAL HIGH (ref 0.0–149.0)
VLDL: 54.8 mg/dL — ABNORMAL HIGH (ref 0.0–40.0)

## 2023-10-23 LAB — TSH: TSH: 1.72 u[IU]/mL (ref 0.35–5.50)

## 2023-10-23 LAB — HEMOGLOBIN A1C: Hgb A1c MFr Bld: 10.6 % — ABNORMAL HIGH (ref 4.6–6.5)

## 2023-10-30 MED ORDER — TRULICITY 3 MG/0.5ML ~~LOC~~ SOAJ: 3.0000 mg | SUBCUTANEOUS | 3 refills | Status: DC

## 2023-11-01 ENCOUNTER — Other Ambulatory Visit: Payer: Self-pay | Admitting: Family Medicine

## 2023-11-01 NOTE — Assessment & Plan Note (Signed)
Chronic problem.  On Jardiance 10mg  daily and Trulicity 1.5mg  weekly.  She admits she has not been eating right or exercising.  She is overdue on eye exam- will schedule.  Foot exam done today.  Microalbumin ordered.  Check labs.  Adjust meds prn

## 2023-11-01 NOTE — Assessment & Plan Note (Signed)
Ongoing issue for pt.  She has had a really hard year.  Currently on Fluoxetine 40mg  daily and Wellbutrin 150mg  daily.  At this time, she feels medication doses are appropriate and she is working on getting back on track with her health and well being.  No med changes at this time but will follow.

## 2023-11-01 NOTE — Assessment & Plan Note (Signed)
Ongoing issue for pt.  Currently on Lipitor 10mg  daily and Fenofibrate 160mg  daily.  Stressed need for healthy diet and regular exercise.  Check labs.  Adjust meds prn

## 2023-11-01 NOTE — Assessment & Plan Note (Signed)
Ongoing issue for pt.  BMI 40.46  She knows she has not been taking care of herself.  Encouraged low carb diet and regular physical activity.  Will continue to follow.

## 2023-11-01 NOTE — Assessment & Plan Note (Signed)
Chronic problem.  Currently well controlled on Losartan 100mg  daily.  Asymptomatic at this time.  Check labs due to ARB but no anticipated med changes.  Will follow.

## 2023-11-05 ENCOUNTER — Other Ambulatory Visit: Payer: Self-pay | Admitting: Family Medicine

## 2023-11-05 DIAGNOSIS — E119 Type 2 diabetes mellitus without complications: Secondary | ICD-10-CM

## 2023-11-06 ENCOUNTER — Other Ambulatory Visit: Payer: Self-pay | Admitting: Family Medicine

## 2023-11-16 ENCOUNTER — Telehealth: Payer: Self-pay | Admitting: Family Medicine

## 2023-11-16 NOTE — Telephone Encounter (Signed)
Caller name: SARALEE GENTZEL  On DPR?: Yes  Call back number: [phone]  Provider they see: Sheliah Hatch, MD  Reason for call:   Pt states taking Wellbutrin XL hasn't helped at all and wants advise on what to do next.

## 2023-11-16 NOTE — Telephone Encounter (Signed)
Encourage patient to contact the pharmacy for refills or they can request refills through [facility]  WHAT PHARMACY WOULD THEY LIKE THIS SENT TO:  [id number] - [place], [place] - [address]   MEDICATION NAME & DOSE: omeprazole (PRILOSEC) 20 MG capsule   NOTES/COMMENTS FROM PATIENT:      Front office please notify patient: It takes 48-72 hours to process rx refill requests Ask patient to call pharmacy to ensure rx is ready before heading there.

## 2023-12-06 ENCOUNTER — Encounter: Payer: Self-pay | Admitting: Family Medicine

## 2024-01-01 ENCOUNTER — Ambulatory Visit: Payer: No Typology Code available for payment source | Admitting: Family Medicine

## 2024-01-01 ENCOUNTER — Encounter: Payer: Self-pay | Admitting: Family Medicine

## 2024-01-01 VITALS — BP 122/78 | HR 99 | Temp 97.8°F | Ht 68.0 in | Wt 258.2 lb

## 2024-01-01 DIAGNOSIS — L600 Ingrowing nail: Secondary | ICD-10-CM

## 2024-01-01 DIAGNOSIS — F339 Major depressive disorder, recurrent, unspecified: Secondary | ICD-10-CM | POA: Diagnosis not present

## 2024-01-01 DIAGNOSIS — Z23 Encounter for immunization: Secondary | ICD-10-CM | POA: Diagnosis not present

## 2024-01-01 MED ORDER — FLUOXETINE HCL 40 MG PO CAPS: ORAL_CAPSULE | ORAL | 1 refills | Status: DC

## 2024-01-01 NOTE — Progress Notes (Unsigned)
   Subjective:    Patient ID: Vanessa Crawford, female    DOB: April 11, 1961, 63 y.o.   MRN: [id number]  HPI Depression- in November she was taking both Fluoxetine 40mg  daily and Wellbutrin 150mg  daily.  She ran out of Fluoxetine at some point and is not currently taking.  Pt reports feeling 'all over the place'.    R great toe pain- pain along medial nail edge, 'so sore I can barely touch it'.  No redness or swelling.  No drainage from nail.   Review of Systems For ROS see HPI     Objective:   Physical Exam Vitals reviewed.  Constitutional:      General: She is not in acute distress.    Appearance: Normal appearance. She is obese. She is not ill-appearing.  HENT:     Head: Normocephalic and atraumatic.  Eyes:     Extraocular Movements: Extraocular movements intact.     Conjunctiva/sclera: Conjunctivae normal.  Cardiovascular:     Rate and Rhythm: Normal rate and regular rhythm.  Pulmonary:     Effort: Pulmonary effort is normal. No respiratory distress.     Breath sounds: Normal breath sounds.  Musculoskeletal:     Cervical back: Neck supple.  Lymphadenopathy:     Cervical: No cervical adenopathy.  Skin:    General: Skin is warm and dry.     Findings: Erythema (mild erythema along medial edge of R great toe w/o induration) present.  Neurological:     General: No focal deficit present.     Mental Status: She is alert and oriented to person, place, and time.  Psychiatric:        Mood and Affect: Mood normal.        Behavior: Behavior normal.        Thought Content: Thought content normal.           Assessment & Plan:  Ingrown toenail- new.  R great toe.  No evidence of infxn so no abx needed.  Will refer to podiatry.  Encouraged epsom salt soaks.  Reviewed supportive care and red flags that should prompt return.  Pt expressed understanding and is in agreement w/ plan.

## 2024-01-01 NOTE — Patient Instructions (Signed)
Follow up in 1 month to recheck mood and sugars RESTART the Fluoxetine 40mg  daily CONTINUE the Wellbutrin 150mg  daily Make sure you are taking time for yourself We'll call you to schedule the podiatry appt Soak your foot in warm water and epsom salt Call with any questions or concerns Hang in there!!!

## 2024-01-05 NOTE — Assessment & Plan Note (Signed)
Deteriorated.  At some point, pt stopped her fluoxetine and is now only taking Wellbutrin.  She reports depression has worsened.  Will restart her Fluoxetine- she prefers to start at the 40mg  dose rather than the 20mg  dose- and monitor closely.  Pt expressed understanding and is in agreement w/ plan.

## 2024-01-21 ENCOUNTER — Ambulatory Visit: Payer: No Typology Code available for payment source | Admitting: Podiatry

## 2024-01-21 DIAGNOSIS — E1165 Type 2 diabetes mellitus with hyperglycemia: Secondary | ICD-10-CM | POA: Diagnosis not present

## 2024-01-21 DIAGNOSIS — L600 Ingrowing nail: Secondary | ICD-10-CM | POA: Diagnosis not present

## 2024-01-21 NOTE — Patient Instructions (Signed)

## 2024-01-21 NOTE — Progress Notes (Signed)
       Subjective:  Patient ID: Vanessa Crawford, female    DOB: 08-11-1961,  MRN: [id number]  Vanessa Crawford presents to clinic today for:  Chief Complaint  Patient presents with   Ingrown Toenail    Right great Ingrown bilat borders. Been present for years. Now painful. Last A1c 10.3 in Nov.  She sees her PCP next week. No anticoags.     PCP is Vanessa Comer BRAVO, MD.  Allergies  Allergen Reactions   Penicillin V Hives   Aspirin     REACTION: GI upset   Clarithromycin  Other (See Comments)   Penicillins     REACTION: unknown rxn to generic pcn   Poison Ivy Extract    Sulfonamide Derivatives     REACTION: unknown reaction    Review of Systems: Negative except as noted in the HPI.  Objective:  NURY NEBERGALL is a pleasant 63 y.o. female in NAD. AAO x 3.  Vascular Examination: Capillary refill time is 3-5 seconds to toes bilateral. Palpable pedal pulses b/l LE. Digital hair present b/l. No pedal edema b/l. Skin temperature gradient WNL b/l. No varicosities b/l. No cyanosis or clubbing noted b/l.   Dermatological Examination: There is incurvation of the bilateral hallux, medial and lateral nail border.  There is pain on palpation of the affected nail borders.  No surrounding erythema or drainage is noted.  Neurological Examination: Epicritic sensation intact bilateral     Latest Ref Rng & Units 10/22/2023    2:51 PM  Hemoglobin A1C  Hemoglobin-A1c 4.6 - 6.5 % 10.6     Assessment/Plan: 1. Ingrown toenail   2. Uncontrolled type 2 diabetes mellitus with hyperglycemia (HCC)    Discussed patient's condition today.  Discussed risks of performing an ingrown toenail procedure with an elevated A1c level over 8%.  She is at increased risk for infection, wound formation, and delayed healing.  She opted to proceed today.  After obtaining patient consent, the bilateral hallux was anesthetized with a 50:50 mixture of 1% lidocaine plain and 0.5% bupivacaine plain for a total of  3cc's administered.  Upon confirmation of anesthesia, a freer elevator was utilized to free the bilateral hallux, medial and lateral nail border from the nail bed.  The nail border was then avulsed proximal to the eponychium and removed in toto.  The area was inspected for any remaining spicules.  A chemical matrixectomy was performed with NaOH and neutralized with acetic acid solution.  Antibiotic ointment and a DSD were applied, followed by a Coban dressing.  Patient tolerated the anesthetic and procedure well and will f/u in 2-3 weeks for recheck.  Patient given post-procedure instructions for daily 15-minute Epsom salt soaks, antibiotic ointment and daily use of Bandaids until toe starts to dry / form eschar.    Return in about 2 weeks (around 02/04/2024) for PNA recheck.   Awanda CHARM Imperial, DPM, FACFAS [facility]     [address] [place], [place] [address]                Office [phone]  Fax [phone]

## 2024-01-29 ENCOUNTER — Ambulatory Visit: Payer: No Typology Code available for payment source | Admitting: Family Medicine

## 2024-02-04 ENCOUNTER — Ambulatory Visit: Payer: No Typology Code available for payment source | Admitting: Podiatry

## 2024-02-05 ENCOUNTER — Ambulatory Visit: Payer: No Typology Code available for payment source | Admitting: Podiatry

## 2024-02-05 DIAGNOSIS — S90421A Blister (nonthermal), right great toe, initial encounter: Secondary | ICD-10-CM

## 2024-02-05 MED ORDER — SILVER SULFADIAZINE 1 % EX CREA: 1.0000 | TOPICAL_CREAM | Freq: Every day | CUTANEOUS | 0 refills | Status: AC

## 2024-02-05 NOTE — Progress Notes (Signed)
       Subjective:  Patient ID: Vanessa Crawford, female    DOB: 11-Feb-1961,  MRN: [id number]  Chief Complaint  Patient presents with   PNA recheck    PNA bilateral great toes, bilateral borders. Toes are scabbed, and a little red around the cuticle. She is soaking BID but not using any ointment. Last A1c in Nov was10.6 and she goes to PCP for updated blood work on Monday. No anticoag    Vanessa Crawford presents to clinic today for f/u of PNA to the bilateral hallux, medial and lateral borders.  She has not been applying antibiotic ointment to the area.  She was performing Epsom salt soaks on a daily basis.  She is having minimal to no pain to the area and is no longer wrapping the toe.  PCP is Sheliah Hatch, MD.  Allergies  Allergen Reactions   Penicillin V Hives   Aspirin     REACTION: GI upset   Clarithromycin Other (See Comments)   Penicillins     REACTION: unknown rxn to generic pcn   Poison Ivy Extract    Sulfonamide Derivatives     REACTION: unknown reaction    Objective:  Vascular Examination: Capillary refill time is 3-5 seconds to toes bilateral. Palpable pedal pulses b/l LE. Digital hair present b/l. No pedal edema b/l. Skin temperature gradient WNL b/l. No varicosities b/l. No cyanosis or clubbing noted b/l.   Dermatological Examination: Upon inspection of the PNA site, there are no clinical signs of infection.  No purulence, no necrosis, no malodor present.  Minimal to no erythema present.  Eschar formed along nail margins.  There is a small blister that has formed near the right hallux proximal nail fold on the lateral margin.  This is most likely due to the sodium hydroxide reaction.  Minimal to no pain on palpation of area.   Assessment/Plan: 1. Blister of great toe of right foot, initial encounter     Meds ordered this encounter  Medications   silver sulfADIAZINE (SILVADENE) 1 % cream    Sig: Apply 1 Application topically daily.    Dispense:  50 g     Refill:  0   Informed the patient that will send in Rx Silvadene cream.  Did confirm that she is not allergic to this.  However upon entering order there is noted history with an unknown reaction to sulfonamide derivatives.  Patient to use caution when applying this and if any reaction develops she needs to discontinue the ointment.  She will only use this if the blister bursts or she starts developing pain in the area.  She can D/C the Epsom salt soaks at this time.  If the blister does pop and drain she will resume the Epsom salt soaks until this has dried out.  Return if symptoms worsen or fail to improve.   Clerance Lav, DPM, FACFAS [facility]     [address] [place], [place] [address]                Office [phone]  Fax [phone]

## 2024-02-08 ENCOUNTER — Ambulatory Visit: Payer: No Typology Code available for payment source | Admitting: Family Medicine

## 2024-02-08 ENCOUNTER — Encounter: Payer: Self-pay | Admitting: Family Medicine

## 2024-02-08 VITALS — BP 132/70 | HR 78 | Temp 98.9°F | Ht 68.0 in | Wt 260.2 lb

## 2024-02-08 DIAGNOSIS — E119 Type 2 diabetes mellitus without complications: Secondary | ICD-10-CM

## 2024-02-08 DIAGNOSIS — F339 Major depressive disorder, recurrent, unspecified: Secondary | ICD-10-CM | POA: Diagnosis not present

## 2024-02-08 NOTE — Progress Notes (Unsigned)
   Subjective:    Patient ID: Vanessa Crawford, female    DOB: 01-Feb-1961, 63 y.o.   MRN: [id number]  HPI Depression- ongoing issue.  At last visit we restarted the Fluoxetine 40mg  daily in addition to the Wellbutrin.  Pt reports mood is much better.  Medication is helping.  Things are more even keel.  She still struggles w/ motivation/procrastination.    DM- chronic problem, on Trulicity 3mg  weekly, Jardiance 10mg  daily.  On ARB for renal protection.  UTD on foot exam.  Due for eye exam.  Denies CP, SOB, HA's, visual changes, edema.  No numbness/tingling of hands/feet.   Review of Systems For ROS see HPI     Objective:   Physical Exam Vitals reviewed.  Constitutional:      General: She is not in acute distress.    Appearance: Normal appearance. She is well-developed. She is obese. She is not ill-appearing.  HENT:     Head: Normocephalic and atraumatic.  Eyes:     Conjunctiva/sclera: Conjunctivae normal.     Pupils: Pupils are equal, round, and reactive to light.  Neck:     Thyroid: No thyromegaly.  Cardiovascular:     Rate and Rhythm: Normal rate and regular rhythm.     Pulses: Normal pulses.     Heart sounds: Normal heart sounds. No murmur heard. Pulmonary:     Effort: Pulmonary effort is normal. No respiratory distress.     Breath sounds: Normal breath sounds.  Abdominal:     General: There is no distension.     Palpations: Abdomen is soft.     Tenderness: There is no abdominal tenderness.  Musculoskeletal:     Cervical back: Normal range of motion and neck supple.     Right lower leg: No edema.     Left lower leg: No edema.  Lymphadenopathy:     Cervical: No cervical adenopathy.  Skin:    General: Skin is warm and dry.  Neurological:     General: No focal deficit present.     Mental Status: She is alert and oriented to person, place, and time.  Psychiatric:        Mood and Affect: Mood normal.        Behavior: Behavior normal.           Assessment & Plan:

## 2024-02-08 NOTE — Patient Instructions (Signed)
 Schedule your complete physical in 3-4 months We'll notify you of your lab results and make any changes if needed Continue to work on healthy diet and regular exercise- you can do it! Schedule your eye exam Call with any questions or concerns Stay Safe!  Stay Healthy! Happy Spring!!

## 2024-02-09 ENCOUNTER — Other Ambulatory Visit: Payer: Self-pay | Admitting: Family Medicine

## 2024-02-09 LAB — HEMOGLOBIN A1C: Hgb A1c MFr Bld: 9.3 % — ABNORMAL HIGH (ref 4.6–6.5)

## 2024-02-09 LAB — BASIC METABOLIC PANEL
BUN: 15 mg/dL (ref 6–23)
CO2: 22 meq/L (ref 19–32)
Calcium: 9.4 mg/dL (ref 8.4–10.5)
Chloride: 102 meq/L (ref 96–112)
Creatinine, Ser: 0.81 mg/dL (ref 0.40–1.20)
GFR: 77.69 mL/min (ref >60.00)
Glucose, Bld: 116 mg/dL — ABNORMAL HIGH (ref 70–99)
Potassium: 3.9 meq/L (ref 3.5–5.1)
Sodium: 135 meq/L (ref 135–145)

## 2024-02-09 MED ORDER — FEXOFENADINE HCL 180 MG PO TABS: ORAL_TABLET | ORAL | 0 refills | Status: AC

## 2024-02-09 MED ORDER — ATORVASTATIN CALCIUM 10 MG PO TABS: 10.0000 mg | ORAL_TABLET | Freq: Every day | ORAL | 0 refills | Status: DC

## 2024-02-09 MED ORDER — OMEPRAZOLE 20 MG PO CPDR: 20.0000 mg | DELAYED_RELEASE_CAPSULE | Freq: Every day | ORAL | 0 refills | Status: DC

## 2024-02-09 NOTE — Assessment & Plan Note (Signed)
 Chronic problem.  Currently on Trulicity 3mg  weekly and Jardiance 10mg  daily.  Currently on ARB for renal protection.  UTD on foot exam.  Needs to schedule eye exam.  Check labs.  Adjust meds prn

## 2024-02-09 NOTE — Assessment & Plan Note (Signed)
 Much improved w/ addition of Fluoxetine 40mg  daily.  Feels medication is appropriate and not interested in adjusting dose at this time.  Will continue to follow.

## 2024-02-19 ENCOUNTER — Encounter: Payer: Self-pay | Admitting: Family Medicine

## 2024-02-19 ENCOUNTER — Other Ambulatory Visit: Payer: Self-pay

## 2024-02-19 ENCOUNTER — Other Ambulatory Visit: Payer: Self-pay | Admitting: Family Medicine

## 2024-02-19 MED ORDER — TRULICITY 3 MG/0.5ML ~~LOC~~ SOAJ: 4.5000 mg | SUBCUTANEOUS | 3 refills | Status: DC

## 2024-02-22 ENCOUNTER — Other Ambulatory Visit: Payer: Self-pay | Admitting: Family Medicine

## 2024-03-05 ENCOUNTER — Other Ambulatory Visit: Payer: Self-pay | Admitting: Family Medicine

## 2024-05-04 ENCOUNTER — Other Ambulatory Visit: Payer: Self-pay | Admitting: Family Medicine

## 2024-05-23 ENCOUNTER — Other Ambulatory Visit: Payer: Self-pay | Admitting: Family Medicine

## 2024-05-28 ENCOUNTER — Other Ambulatory Visit: Payer: Self-pay | Admitting: Family Medicine

## 2024-05-28 DIAGNOSIS — I10 Essential (primary) hypertension: Secondary | ICD-10-CM

## 2024-06-26 ENCOUNTER — Other Ambulatory Visit: Payer: Self-pay | Admitting: Family Medicine

## 2024-06-26 DIAGNOSIS — F339 Major depressive disorder, recurrent, unspecified: Secondary | ICD-10-CM

## 2024-08-22 ENCOUNTER — Other Ambulatory Visit: Payer: Self-pay | Admitting: Family Medicine

## 2024-08-22 NOTE — Telephone Encounter (Signed)
 Spoke to patient, she's currently out of town but will go on MyChart when she returns to schedule a follow-up visit

## 2024-10-26 ENCOUNTER — Other Ambulatory Visit: Payer: Self-pay | Admitting: Family Medicine

## 2024-11-19 ENCOUNTER — Other Ambulatory Visit: Payer: Self-pay | Admitting: Family Medicine

## 2024-12-27 ENCOUNTER — Other Ambulatory Visit: Payer: Self-pay | Admitting: Family Medicine
